# Patient Record
Sex: Female | Born: 1985 | Race: Black or African American | Hispanic: No | Marital: Single | Smoking: Current every day smoker
Health system: Southern US, Community
[De-identification: ages and names within clinical notes are randomized; demographics above are authoritative.]

## PROBLEM LIST (undated history)

## (undated) ENCOUNTER — Inpatient Hospital Stay: Payer: Self-pay

## (undated) DIAGNOSIS — G5600 Carpal tunnel syndrome, unspecified upper limb: Secondary | ICD-10-CM

## (undated) DIAGNOSIS — R0600 Dyspnea, unspecified: Secondary | ICD-10-CM

## (undated) DIAGNOSIS — K3580 Unspecified acute appendicitis: Secondary | ICD-10-CM

## (undated) DIAGNOSIS — IMO0002 Reserved for concepts with insufficient information to code with codable children: Secondary | ICD-10-CM

## (undated) HISTORY — DX: Unspecified acute appendicitis: K35.80

## (undated) HISTORY — DX: Dyspnea, unspecified: R06.00

## (undated) HISTORY — DX: Carpal tunnel syndrome, unspecified upper limb: G56.00

## (undated) HISTORY — PX: TONSILLECTOMY: SUR1361

---
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
## 2007-05-08 ENCOUNTER — Emergency Department: Payer: Self-pay | Admitting: Emergency Medicine

## 2007-08-06 ENCOUNTER — Emergency Department: Payer: Self-pay | Admitting: Emergency Medicine

## 2007-09-10 ENCOUNTER — Emergency Department: Payer: Self-pay | Admitting: Emergency Medicine

## 2008-06-26 ENCOUNTER — Emergency Department: Payer: Self-pay | Admitting: Emergency Medicine

## 2008-08-11 ENCOUNTER — Emergency Department: Payer: Self-pay | Admitting: Unknown Physician Specialty

## 2008-08-12 ENCOUNTER — Emergency Department: Payer: Self-pay | Admitting: Emergency Medicine

## 2008-12-26 ENCOUNTER — Emergency Department: Payer: Self-pay | Admitting: Internal Medicine

## 2009-03-18 ENCOUNTER — Emergency Department: Payer: Self-pay | Admitting: Emergency Medicine

## 2009-04-01 ENCOUNTER — Emergency Department: Payer: Self-pay | Admitting: Emergency Medicine

## 2009-05-30 ENCOUNTER — Emergency Department: Payer: Self-pay | Admitting: Emergency Medicine

## 2009-08-13 ENCOUNTER — Emergency Department: Payer: Self-pay | Admitting: Emergency Medicine

## 2009-08-14 ENCOUNTER — Observation Stay: Payer: Self-pay

## 2009-09-26 ENCOUNTER — Emergency Department: Payer: Self-pay | Admitting: Unknown Physician Specialty

## 2009-11-23 ENCOUNTER — Emergency Department: Payer: Self-pay | Admitting: Emergency Medicine

## 2009-12-12 ENCOUNTER — Emergency Department: Payer: Self-pay | Admitting: Emergency Medicine

## 2010-07-22 IMAGING — CT CT STONE STUDY
1 of 2 series · 15 of 32 positions shown, 19 images · non-contrast
Comparison: none

REASON FOR EXAM: right flank pain with hematuria
COMMENTS:   LMP: Two weeks ago

PROCEDURE:     CT  - CT ABDOMEN /PELVIS WO (STONE)  - September 26, 2009  [DATE]
RESULT:     CT of the abdomen and pelvis dated 09/26/2009.
TECHNIQUE: Helical noncontrasted 3 mm sections were obtained from lung bases
through the pubic symphysis.

[Series 2: stone · axial · 0.67mm/px · z∈[-357,+54]mm · 15 of 155 slices shown, 19 images]
[im 12/155  soft-tissue]
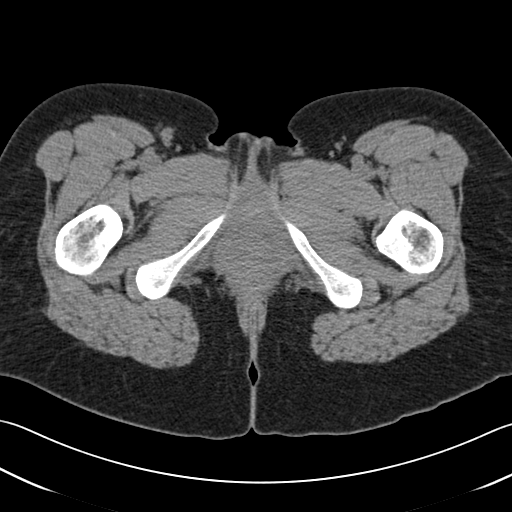
[im 12/155  bone]
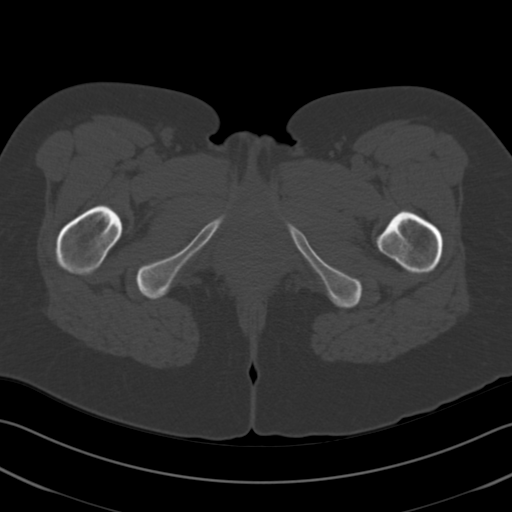
[im 23/155  soft-tissue]
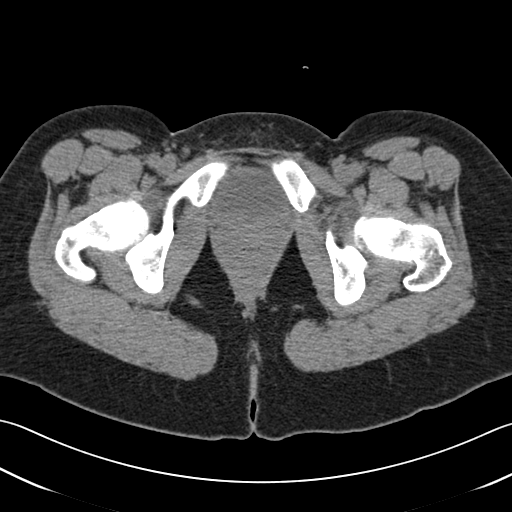
[im 35/155  soft-tissue]
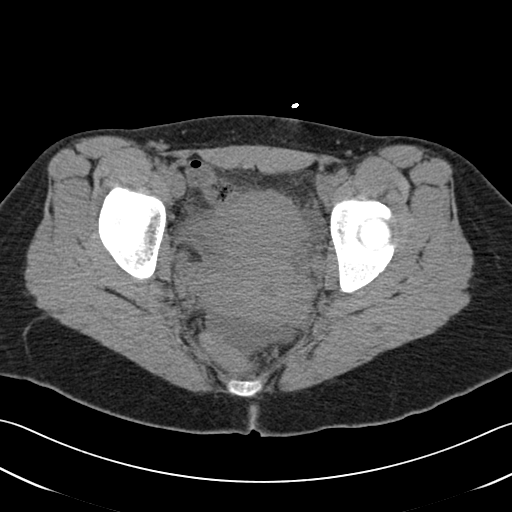
[im 46/155  soft-tissue]
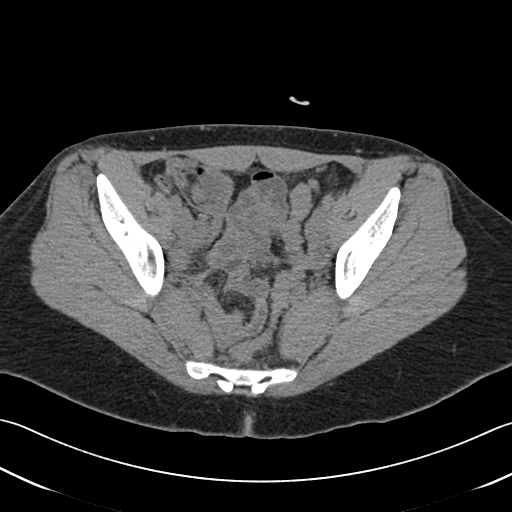
[im 58/155  soft-tissue]
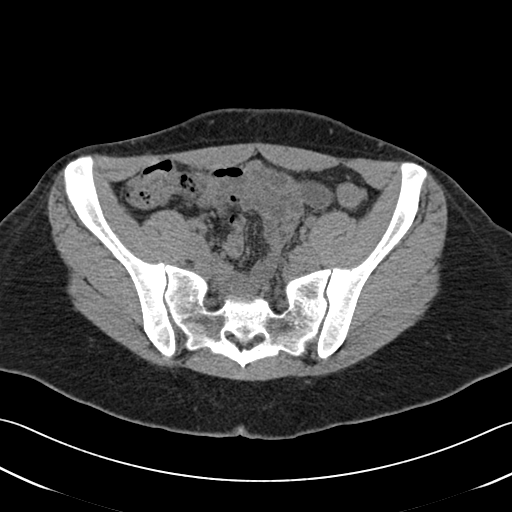
[im 69/155  soft-tissue]
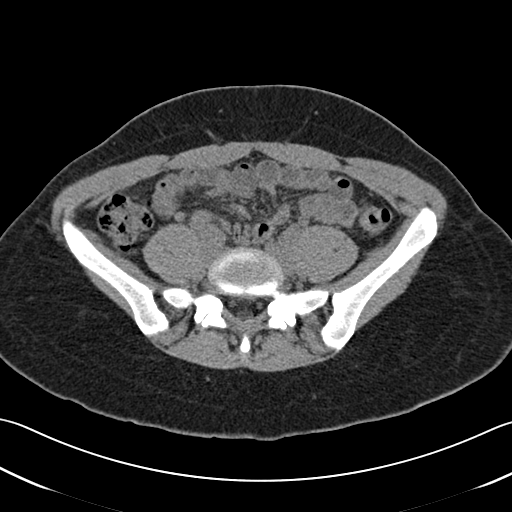
[im 80/155  soft-tissue]
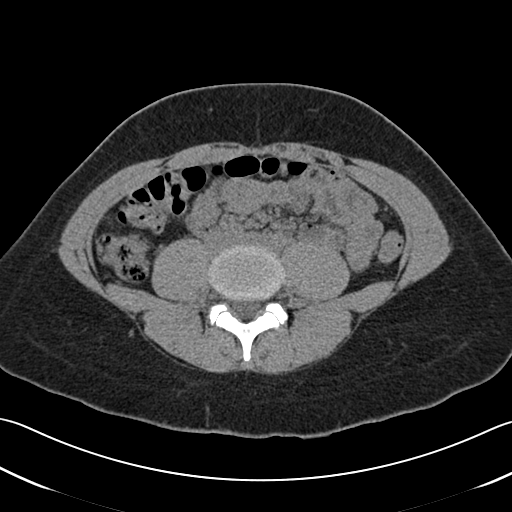
[im 92/155  soft-tissue]
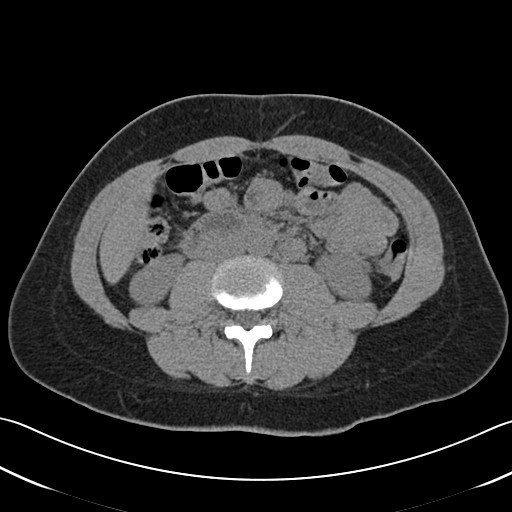
[im 103/155  soft-tissue]
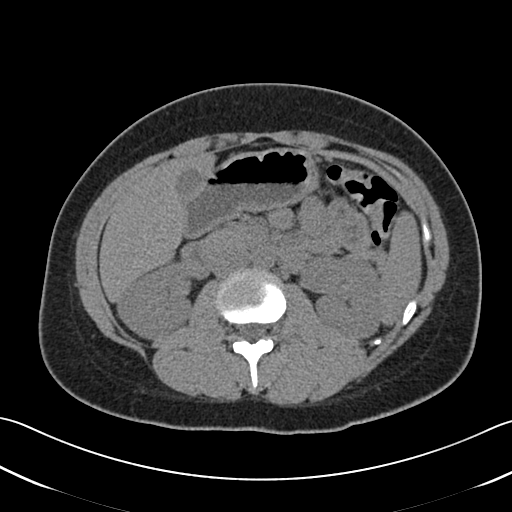
[im 103/155  bone]
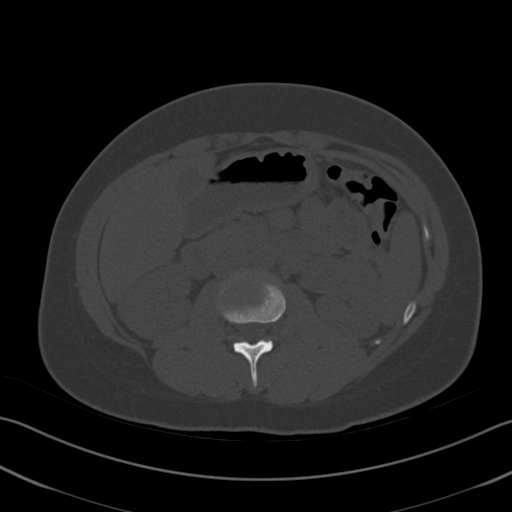
[im 115/155  soft-tissue]
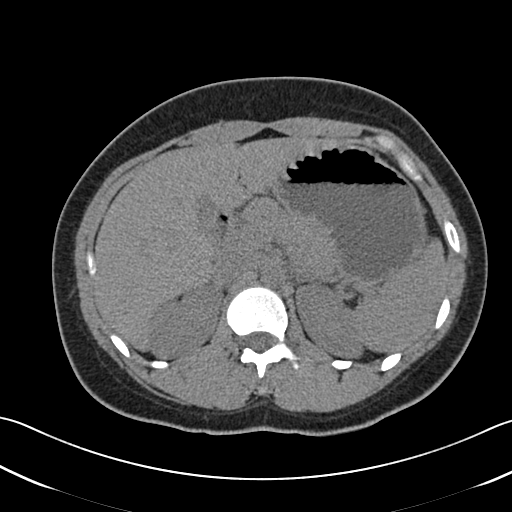
[im 126/155  soft-tissue]
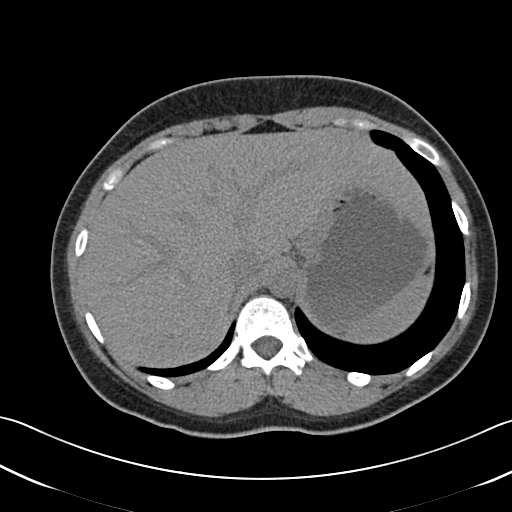
[im 132/155  lung]
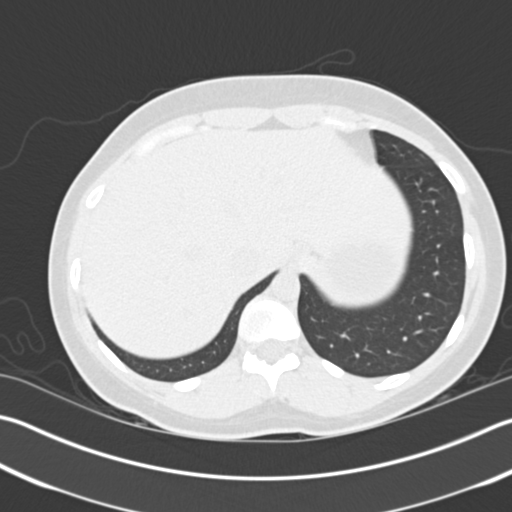
[im 137/155  soft-tissue]
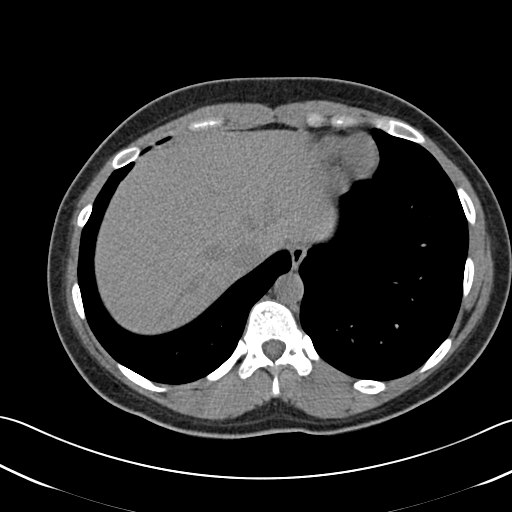
[im 137/155  lung]
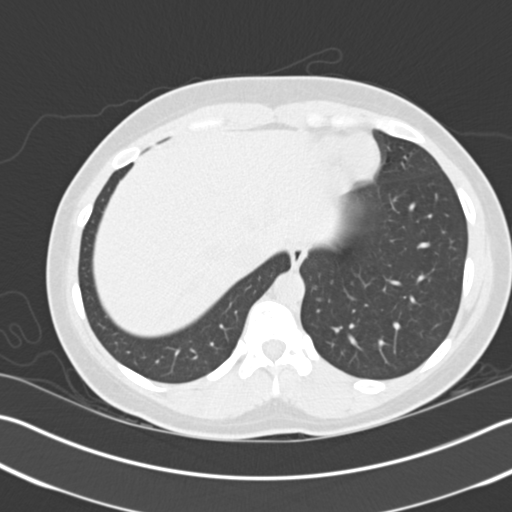
[im 143/155  lung]
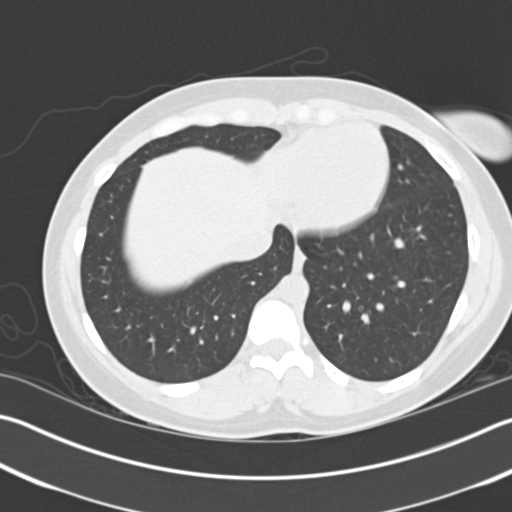
[im 149/155  soft-tissue]
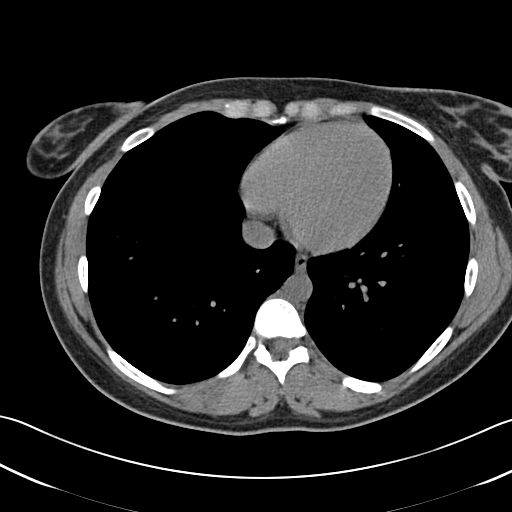
[im 149/155  lung]
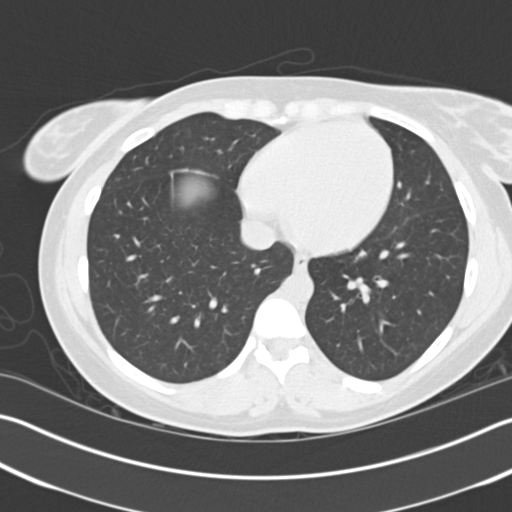

[15 of 32 positions shown; findings below may reference images not displayed]

FINDINGS: Evaluation of the lung bases demonstrates no gross abnormalities.

Within the limitations of a noncontrasted CT the liver, spleen, adrenals,
pancreas, kidneys are unremarkable. Specifically there is no evidence of
hydronephrosis, hydroureter, nephrolithiasis nor ureterolithiasis. A small
amount of free fluid is identified in the region of the cul-de-sac.

There is no CT evidence of bowel obstruction, enteritis, colitis, nor
diverticulitis within the limitations of a noncontrasted CT. There is no CT
evidence of abdominal aortic aneurysm.

A small fat-containing umbilical hernia is appreciated.
IMPRESSION: No CT evidence of obstructive or inflammatory abnormalities
within the limitations of a noncontrasted renal colic protocol CT.
2. Small amount of fluid within the cul-de-sac possibly physiologic.

## 2010-10-01 ENCOUNTER — Emergency Department: Payer: Self-pay | Admitting: Emergency Medicine

## 2011-03-11 ENCOUNTER — Inpatient Hospital Stay: Payer: Self-pay

## 2011-06-14 ENCOUNTER — Emergency Department: Payer: Self-pay | Admitting: Emergency Medicine

## 2011-08-05 ENCOUNTER — Emergency Department: Payer: Self-pay | Admitting: Emergency Medicine

## 2012-03-24 ENCOUNTER — Emergency Department: Payer: Self-pay | Admitting: Unknown Physician Specialty

## 2012-03-25 ENCOUNTER — Emergency Department: Payer: Self-pay | Admitting: Emergency Medicine

## 2012-05-10 ENCOUNTER — Emergency Department: Payer: Self-pay | Admitting: Emergency Medicine

## 2013-05-12 ENCOUNTER — Emergency Department: Payer: Self-pay | Admitting: Emergency Medicine

## 2013-10-13 ENCOUNTER — Emergency Department: Payer: Self-pay | Admitting: Emergency Medicine

## 2013-10-13 LAB — COMPREHENSIVE METABOLIC PANEL
ALK PHOS: 57 U/L
ANION GAP: 5 — AB (ref 7–16)
AST: 27 U/L (ref 15–37)
Albumin: 3.8 g/dL (ref 3.4–5.0)
BUN: 10 mg/dL (ref 7–18)
Bilirubin,Total: 0.5 mg/dL (ref 0.2–1.0)
CALCIUM: 9 mg/dL (ref 8.5–10.1)
CHLORIDE: 106 mmol/L (ref 98–107)
Co2: 27 mmol/L (ref 21–32)
Creatinine: 0.85 mg/dL (ref 0.60–1.30)
EGFR (African American): >60
EGFR (Non-African Amer.): >60
Glucose: 97 mg/dL (ref 65–99)
OSMOLALITY: 275 (ref 275–301)
Potassium: 3.7 mmol/L (ref 3.5–5.1)
SGPT (ALT): 32 U/L
SODIUM: 138 mmol/L (ref 136–145)
Total Protein: 8 g/dL (ref 6.4–8.2)

## 2013-10-13 LAB — CBC WITH DIFFERENTIAL/PLATELET
Basophil #: 0.1 10*3/uL (ref 0.0–0.1)
Basophil %: 0.4 %
Eosinophil #: 0.1 10*3/uL (ref 0.0–0.7)
Eosinophil %: 1.2 %
HCT: 44.7 % (ref 35.0–47.0)
HGB: 14.4 g/dL (ref 12.0–16.0)
LYMPHS PCT: 13.5 %
Lymphocyte #: 1.7 10*3/uL (ref 1.0–3.6)
MCH: 28.4 pg (ref 26.0–34.0)
MCHC: 32.1 g/dL (ref 32.0–36.0)
MCV: 89 fL (ref 80–100)
Monocyte #: 0.5 x10 3/mm (ref 0.2–0.9)
Monocyte %: 3.6 %
Neutrophil #: 10.2 10*3/uL — ABNORMAL HIGH (ref 1.4–6.5)
Neutrophil %: 81.3 %
PLATELETS: 320 10*3/uL (ref 150–440)
RBC: 5.05 10*6/uL (ref 3.80–5.20)
RDW: 13.1 % (ref 11.5–14.5)
WBC: 12.6 10*3/uL — ABNORMAL HIGH (ref 3.6–11.0)

## 2013-10-13 LAB — URINALYSIS, COMPLETE
BACTERIA: NONE SEEN
Bilirubin,UR: NEGATIVE
Blood: NEGATIVE
GLUCOSE, UR: NEGATIVE mg/dL (ref 0–75)
Ketone: NEGATIVE
Leukocyte Esterase: NEGATIVE
Nitrite: NEGATIVE
Ph: 6 (ref 4.5–8.0)
Protein: NEGATIVE
Specific Gravity: 1.025 (ref 1.003–1.030)
Squamous Epithelial: 8
WBC UR: 6 /HPF (ref 0–5)

## 2013-10-13 LAB — LIPASE, BLOOD: Lipase: 100 U/L (ref 73–393)

## 2014-03-24 NOTE — L&D Delivery Note (Signed)
VAGINAL DELIVERY NOTE:  Date of Delivery: 02/07/2015 Primary OB: [facility]  Gestational Age/EDD: 1734w3d 02/18/2015, by Other Basis Antepartum complications: Cervical Dysplasia with HPV, history PTD Attending Physician: Annamarie MajorPaul Delbert Darley, MD, FACOG Delivery Type: spontaneous vaginal delivery  Anesthesia: none Laceration: none Episiotomy: none Placenta: spontaneous Intrapartum complications: None Estimated Blood Loss: 250 GBS: neg Procedure Details: precipitous Baby: Liveborn female, Apgars 9/9

## 2014-11-04 ENCOUNTER — Encounter: Payer: Self-pay | Admitting: Emergency Medicine

## 2014-11-04 ENCOUNTER — Emergency Department
Admission: EM | Admit: 2014-11-04 | Discharge: 2014-11-04 | Disposition: A | Discharge status: Home / Self Care | Payer: Medicaid Other | Attending: Emergency Medicine | Admitting: Emergency Medicine

## 2014-11-04 DIAGNOSIS — B850 Pediculosis due to Pediculus humanus capitis: Secondary | ICD-10-CM | POA: Diagnosis not present

## 2014-11-04 DIAGNOSIS — Z720 Tobacco use: Secondary | ICD-10-CM | POA: Diagnosis not present

## 2014-11-04 MED ORDER — PERMETHRIN 1 % EX LOTN: TOPICAL_LOTION | CUTANEOUS | Status: DC

## 2014-11-04 NOTE — ED Provider Notes (Signed)
[facility] Emergency Department Provider Note  ____________________________________________  Time seen: Approximately 2:50 PM  I have reviewed the triage vital signs and the nursing notes.   HISTORY  Chief Complaint Head Lice    HPI Annette Cline is a 29 y.o. female who presents for evaluation of head lice. Patient states that it has noticed her hair last night as well as the risks of the family. Denies any other complaints at this time.   History reviewed. No pertinent past medical history.  There are no active problems to display for this patient.   History reviewed. No pertinent past surgical history.  Current Outpatient Rx  Name  Route  Sig  Dispense  Refill  . permethrin (PERMETHRIN LICE TREATMENT) 1 % lotion      Apply to affected area once   60 mL   1     Allergies Sulfa antibiotics  History reviewed. No pertinent family history.  Social History Social History  Substance Use Topics  . Smoking status: Current Every Day Smoker -- 1.00 packs/day    Types: Cigarettes  . Smokeless tobacco: None  . Alcohol Use: No    Review of Systems Constitutional: No fever/chills Eyes: No visual changes. ENT: No sore throat. Cardiovascular: Denies chest pain. Respiratory: Denies shortness of breath. Gastrointestinal: No abdominal pain.  No nausea, no vomiting.  No diarrhea.  No constipation. Genitourinary: Negative for dysuria. Musculoskeletal: Negative for back pain. Skin: Negative for rash. Positive for for head lice. Neurological: Negative for headaches, focal weakness or numbness.  10-point ROS otherwise negative.  ____________________________________________   PHYSICAL EXAM:  VITAL SIGNS: ED Triage Vitals  Enc Vitals Group     BP 11/04/14 1152 111/58 mmHg     Pulse Rate 11/04/14 1152 72     Resp --      Temp 11/04/14 1152 98.6 F (37 C)     Temp src --      SpO2 11/04/14 1152 98 %     Weight 11/04/14 1152 179 lb (81.194  kg)     Height 11/04/14 1152 5\' 5"  (1.651 m)     Head Cir --      Peak Flow --      Pain Score --      Pain Loc --      Pain Edu? --      Excl. in GC? --     Constitutional: Alert and oriented. Well appearing and in no acute distress. Eyes: Conjunctivae are normal. PERRL. EOMI. Head: Atraumatic. Positive for head lice. Neurologic:  Normal speech and language. No gross focal neurologic deficits are appreciated. No gait instability. Skin:  Skin is warm, dry and intact. No rash noted. Psychiatric: Mood and affect are normal. Speech and behavior are normal.  ____________________________________________   LABS (all labs ordered are listed, but only abnormal results are displayed)  Labs Reviewed - No data to display ____________________________________________   PROCEDURES  Procedure(s) performed: None  Critical Care performed: No  ____________________________________________   INITIAL IMPRESSION / ASSESSMENT AND PLAN / ED COURSE  Pertinent labs & imaging results that were available during my care of the patient were reviewed by me and considered in my medical decision making (see chart for details).  Head lice. Rx given for permethrin cream as well as instructions given to parents for cleaning techniques. Patient follow-up with PCP or return to the ER with any worsening symptomology. She voices no other emergency medical complaints at this time. ____________________________________________   FINAL  CLINICAL IMPRESSION(S) / ED DIAGNOSES  Final diagnoses:  Head lice infestation      Evangeline Dakin, PA-C 11/04/14 1452  Jene Every, MD 11/04/14 1455

## 2014-11-04 NOTE — Discharge Instructions (Signed)
Head and Pubic Lice °Lice are tiny, light brown insects with claws on the ends of their legs. They are small parasites that live on the human body. Lice often make their home in your hair. They hatch from little round eggs (nits), which are attached to the base of hairs. They spread by: °· Direct contact with an infested person. °· Infested personal items such as combs, brushes, towels, clothing, pillow cases and sheets. °The parasite that causes your condition may also live in clothes which have been worn within the week before treatment. Therefore, it is necessary to wash your clothes, bed linens, towels, combs and brushes. Any woolens can be put in an air-tight plastic bag for one week. You need to use fresh clothes, towels and sheets after your treatment is completed. Re-treatment is usually not necessary if instructions are followed. If necessary, treatment may be repeated in 7 days. The entire family may require treatment. Sexual partners should be treated if the nits are present in the pubic area. °TREATMENT °· Apply enough medicated shampoo or cream to wet hair and skin in and around the infected areas. °· Work thoroughly into hair and leave in according to instructions. °· Add a small amount of water until a good lather forms. °· Rinse thoroughly. °· Towel briskly. °· When hair is dry, any remaining nits, cream or shampoo may be removed with a fine-tooth comb or tweezers. The nits resemble dandruff; however they are glued to the hair follicle and are difficult to brush out. Frequent fine combing and shampoos are necessary. A towel soaked in white vinegar and left on the hair for 2 hours will also help soften the glue which holds the nits on the hair. °Medicated shampoo or cream should not be used on children or pregnant women without a caregiver's prescription or instructions. °SEEK MEDICAL CARE IF:  °· You or your child develops sores that look infected. °· The rash does not go away in one week. °· The  lice or nits return or persist in spite of treatment. °Document Released: 03/10/2005 Document Revised: 06/02/2011 Document Reviewed: 10/07/2006 °ExitCare® Patient Information ©2015 [employer]. This information is not intended to replace advice given to you by your health care provider. Make sure you discuss any questions you have with your health care provider. ° °

## 2014-11-04 NOTE — ED Notes (Signed)
Pt states she noticed lice in her hair and families last night.

## 2015-02-07 ENCOUNTER — Inpatient Hospital Stay
Admission: EM | Admit: 2015-02-07 | Discharge: 2015-02-08 | DRG: 774 | Disposition: A | Discharge status: Home / Self Care | Payer: Medicaid Other | Attending: Obstetrics & Gynecology | Admitting: Obstetrics & Gynecology

## 2015-02-07 ENCOUNTER — Other Ambulatory Visit: Payer: Self-pay | Admitting: Obstetrics & Gynecology

## 2015-02-07 DIAGNOSIS — Z882 Allergy status to sulfonamides status: Secondary | ICD-10-CM | POA: Diagnosis not present

## 2015-02-07 DIAGNOSIS — O9832 Other infections with a predominantly sexual mode of transmission complicating childbirth: Secondary | ICD-10-CM | POA: Diagnosis present

## 2015-02-07 DIAGNOSIS — Z3A38 38 weeks gestation of pregnancy: Secondary | ICD-10-CM

## 2015-02-07 DIAGNOSIS — A630 Anogenital (venereal) warts: Secondary | ICD-10-CM | POA: Diagnosis present

## 2015-02-07 DIAGNOSIS — N879 Dysplasia of cervix uteri, unspecified: Secondary | ICD-10-CM | POA: Diagnosis present

## 2015-02-07 DIAGNOSIS — Z9889 Other specified postprocedural states: Secondary | ICD-10-CM | POA: Diagnosis not present

## 2015-02-07 HISTORY — DX: Reserved for concepts with insufficient information to code with codable children: IMO0002

## 2015-02-07 LAB — CBC
HCT: 35.6 % (ref 35.0–47.0)
Hemoglobin: 11.6 g/dL — ABNORMAL LOW (ref 12.0–16.0)
MCH: 27.8 pg (ref 26.0–34.0)
MCHC: 32.6 g/dL (ref 32.0–36.0)
MCV: 85.2 fL (ref 80.0–100.0)
PLATELETS: 263 10*3/uL (ref 150–440)
RBC: 4.18 MIL/uL (ref 3.80–5.20)
RDW: 13.7 % (ref 11.5–14.5)
WBC: 13.4 10*3/uL — ABNORMAL HIGH (ref 3.6–11.0)

## 2015-02-07 LAB — TYPE AND SCREEN
ABO/RH(D): A POS
Antibody Screen: NEGATIVE

## 2015-02-07 MED ORDER — CITRIC ACID-SODIUM CITRATE 334-500 MG/5ML PO SOLN
30.0000 mL | ORAL | Status: DC | PRN
  Filled 2015-02-07: qty 30

## 2015-02-07 MED ORDER — WITCH HAZEL-GLYCERIN EX PADS: 1.0000 "application " | MEDICATED_PAD | CUTANEOUS | Status: DC | PRN

## 2015-02-07 MED ORDER — OXYCODONE-ACETAMINOPHEN 5-325 MG PO TABS: 2.0000 | ORAL_TABLET | ORAL | Status: DC | PRN

## 2015-02-07 MED ORDER — FENTANYL CITRATE (PF) 100 MCG/2ML IJ SOLN
INTRAMUSCULAR | Status: AC
  Administered 2015-02-07: 50 ug via INTRAVENOUS
  Filled 2015-02-07: qty 2

## 2015-02-07 MED ORDER — SODIUM CHLORIDE 0.9 % IJ SOLN: 3.0000 mL | INTRAMUSCULAR | Status: DC | PRN

## 2015-02-07 MED ORDER — IBUPROFEN 600 MG PO TABS
600.0000 mg | ORAL_TABLET | Freq: Four times a day (QID) | ORAL | Status: DC
  Administered 2015-02-07 – 2015-02-08 (×5): 600 mg via ORAL
  Filled 2015-02-07 (×4): qty 1

## 2015-02-07 MED ORDER — OXYTOCIN BOLUS FROM INFUSION
500.0000 mL | INTRAVENOUS | Status: DC
  Administered 2015-02-07: 500 mL via INTRAVENOUS

## 2015-02-07 MED ORDER — SODIUM CHLORIDE 0.9 % IV SOLN: 250.0000 mL | INTRAVENOUS | Status: DC | PRN

## 2015-02-07 MED ORDER — ONDANSETRON HCL 4 MG PO TABS: 4.0000 mg | ORAL_TABLET | ORAL | Status: DC | PRN

## 2015-02-07 MED ORDER — DIBUCAINE 1 % RE OINT: 1.0000 "application " | TOPICAL_OINTMENT | RECTAL | Status: DC | PRN

## 2015-02-07 MED ORDER — LACTATED RINGERS IV SOLN
INTRAVENOUS | Status: DC
  Administered 2015-02-07: 05:00:00 via INTRAVENOUS

## 2015-02-07 MED ORDER — LIDOCAINE HCL (PF) 1 % IJ SOLN
30.0000 mL | INTRAMUSCULAR | Status: DC | PRN
  Filled 2015-02-07: qty 30

## 2015-02-07 MED ORDER — OXYTOCIN 40 UNITS IN LACTATED RINGERS INFUSION - SIMPLE MED
62.5000 mL/h | INTRAVENOUS | Status: DC
  Administered 2015-02-07: 62.5 mL/h via INTRAVENOUS
  Filled 2015-02-07 (×2): qty 1000

## 2015-02-07 MED ORDER — IBUPROFEN 600 MG PO TABS
ORAL_TABLET | ORAL | Status: AC
  Administered 2015-02-07: 600 mg via ORAL
  Filled 2015-02-07: qty 1

## 2015-02-07 MED ORDER — DIPHENHYDRAMINE HCL 25 MG PO CAPS: 25.0000 mg | ORAL_CAPSULE | Freq: Four times a day (QID) | ORAL | Status: DC | PRN

## 2015-02-07 MED ORDER — SODIUM CHLORIDE 0.9 % IJ SOLN: 3.0000 mL | Freq: Two times a day (BID) | INTRAMUSCULAR | Status: DC

## 2015-02-07 MED ORDER — ACETAMINOPHEN 325 MG PO TABS: 650.0000 mg | ORAL_TABLET | ORAL | Status: DC | PRN

## 2015-02-07 MED ORDER — SIMETHICONE 80 MG PO CHEW: 80.0000 mg | CHEWABLE_TABLET | ORAL | Status: DC | PRN

## 2015-02-07 MED ORDER — ONDANSETRON HCL 4 MG/2ML IJ SOLN: 4.0000 mg | INTRAMUSCULAR | Status: DC | PRN

## 2015-02-07 MED ORDER — FENTANYL CITRATE (PF) 100 MCG/2ML IJ SOLN
50.0000 ug | Freq: Once | INTRAMUSCULAR | Status: AC
  Administered 2015-02-07: 50 ug via INTRAVENOUS

## 2015-02-07 MED ORDER — OXYTOCIN 40 UNITS IN LACTATED RINGERS INFUSION - SIMPLE MED: 62.5000 mL/h | INTRAVENOUS | Status: DC | PRN

## 2015-02-07 MED ORDER — LACTATED RINGERS IV SOLN: 500.0000 mL | INTRAVENOUS | Status: DC | PRN

## 2015-02-07 MED ORDER — SENNOSIDES-DOCUSATE SODIUM 8.6-50 MG PO TABS: 2.0000 | ORAL_TABLET | ORAL | Status: DC

## 2015-02-07 MED ORDER — OXYCODONE-ACETAMINOPHEN 5-325 MG PO TABS: 1.0000 | ORAL_TABLET | ORAL | Status: DC | PRN

## 2015-02-07 MED ORDER — BENZOCAINE-MENTHOL 20-0.5 % EX AERO
1.0000 "application " | INHALATION_SPRAY | CUTANEOUS | Status: DC | PRN
  Administered 2015-02-07: 1 via TOPICAL
  Filled 2015-02-07: qty 56

## 2015-02-07 MED ORDER — ONDANSETRON HCL 4 MG/2ML IJ SOLN: 4.0000 mg | Freq: Four times a day (QID) | INTRAMUSCULAR | Status: DC | PRN

## 2015-02-07 MED ORDER — ZOLPIDEM TARTRATE 5 MG PO TABS: 5.0000 mg | ORAL_TABLET | Freq: Every evening | ORAL | Status: DC | PRN

## 2015-02-07 MED ORDER — OXYCODONE-ACETAMINOPHEN 5-325 MG PO TABS
1.0000 | ORAL_TABLET | ORAL | Status: DC | PRN
  Administered 2015-02-07 – 2015-02-08 (×6): 1 via ORAL
  Filled 2015-02-07 (×6): qty 1

## 2015-02-07 NOTE — H&P (Signed)
Obstetrics Admission History & Physical   Rupture of Membranes   HPI:  29 y.o. [id number] @ 3074w3d (02/18/2015). Admitted on 02/07/2015:   Patient Active Problem List   Diagnosis Date Noted  . Labor and delivery indication for care or intervention 02/07/2015    Presents for labor, with SROM and rapidly progressing.  [facility]  PMHx:  Past Medical History  Diagnosis Date  . Abnormal cervical Pap smear with positive HPV DNA test     HPV  . Preterm labor    PSHx:  Past Surgical History  Procedure Laterality Date  . Tonsillectomy     Medications:  Prescriptions prior to admission  Medication Sig Dispense Refill Last Dose  . permethrin (PERMETHRIN LICE TREATMENT) 1 % lotion Apply to affected area once 60 mL 1    Allergies: is allergic to sulfa antibiotics. OBHx:  OB History  Gravida Para Term Preterm AB SAB TAB Ectopic Multiple Living  8 2 1 1 5 3 2  0 0 2    # Outcome Date GA Lbr Len/2nd Weight Sex Delivery Anes PTL Lv  8 Current           7 Preterm           6 Term           5 TAB           4 TAB           3 SAB           2 SAB           1 SAB              AVW:[id number]:Negative/unremarkable except as detailed in HPI. Soc Hx: Pregnancy welcomed  Objective:   Filed Vitals:   02/07/15 0428  BP: 121/66  Pulse: 85  Temp: 97.9 F (36.6 C)  Resp: 20   General: Well nourished, well developed female in no acute distress.  Skin: Warm and dry.  Cardiovascular:Regular rate and rhythm. Respiratory: Clear to auscultation bilateral. Normal respiratory effort Abdomen: gravid tender w ctxs Neuro/Psych: Normal mood and affect.   Pelvic exam: is not limited by body habitus EGBUS: within normal limits Vagina: within normal limits and with normal mucosa blood in the vault Cervix: 5 cm on admission, then 9 cm Uterus: Spontaneous uterine activity  Adnexa: not evaluated  EFM:FHR: 150 bpm, variability: moderate,  accelerations:  Present,  decelerations:   Absent Toco: Frequency: Every 3 minutes   Perinatal info:  Blood type: A positive Rubella- Immune Varicella -Immune TDaP Given during third trimester of this pregnancy RPR NR / HIV Neg/ HBsAg Neg   Assessment & Plan:   29 y.o. [id number] @ 6274w3d, Admitted on 02/07/2015: labor management     Admit for labor and Fetal Wellbeing Reassuring

## 2015-02-07 NOTE — Discharge Summary (Signed)
Obstetrical Discharge Summary  Date of Admission: 02/07/2015 Date of Discharge: 02/08/2015 Discharge Diagnosis: Term Pregnancy-delivered and Preciritous Labor Primary OB:  [facility]   Gestational Age at Delivery: 2559w3d  Antepartum complications: Cervical Dysplasia/HPV Date of Delivery: 02/07/15  Delivered By: Tiburcio PeaHarris Delivery Type: spontaneous vaginal delivery Intrapartum complications/course: precipitous Anesthesia: none Placenta: spontaneous Laceration: none Episiotomy: none Live born female  Birth Weight:   APGAR: 9, 9   Post partum course: Since the delivery, patient has tolerate activity, diet, and daily functions without difficulty or complication.  Min lochia.  No breast concerns at this time.  No signs of depression currently.   Postpartum Exam:General appearance: alert, appears stated age and no distress GI: soft, non-tender; bowel sounds normal; no masses,  no organomegaly and Fundus firm Extremities: extremities normal, atraumatic, no cyanosis or edema and no edema, redness or tenderness in the calves or thighs  Disposition: home with infant Rh Immune globulin given: no Rubella vaccine given: no Varicella vaccine given: no Tdap vaccine given in AP or PP setting: given during prenatal care Flu vaccine given in AP or PP setting: no Contraception: no method, Nexplanon, to be determined at post partum visit  Prenatal Labs: A+//Rubella Immune//RPR negative//HIV negative/HepB Surface Ag negative//plans to bottle feed  Plan:  Annette Cline was discharged to home in good condition. [facility] weeks at which time we will also make arrangement for you LEEP  Discharge Medications:   Medication List    STOP taking these medications        permethrin 1 % lotion  Commonly known as:  PERMETHRIN LICE TREATMENT      TAKE these medications        ibuprofen 600 MG tablet  Commonly known as:  ADVIL,MOTRIN  Take 1 tablet (600 mg total)  by mouth every 6 (six) hours.         No future appointments.

## 2015-02-07 NOTE — Discharge Instructions (Signed)

## 2015-02-08 LAB — CBC
HEMATOCRIT: 32.4 % — AB (ref 35.0–47.0)
Hemoglobin: 10.4 g/dL — ABNORMAL LOW (ref 12.0–16.0)
MCH: 27.8 pg (ref 26.0–34.0)
MCHC: 32.2 g/dL (ref 32.0–36.0)
MCV: 86.3 fL (ref 80.0–100.0)
Platelets: 216 10*3/uL (ref 150–440)
RBC: 3.75 MIL/uL — ABNORMAL LOW (ref 3.80–5.20)
RDW: 13.6 % (ref 11.5–14.5)
WBC: 13.2 10*3/uL — AB (ref 3.6–11.0)

## 2015-02-08 LAB — RPR: RPR Ser Ql: NONREACTIVE

## 2015-02-08 MED ORDER — IBUPROFEN 600 MG PO TABS: 600.0000 mg | ORAL_TABLET | Freq: Four times a day (QID) | ORAL | Status: DC

## 2015-02-08 NOTE — Progress Notes (Signed)
Patient understands all discharge instructions and the need to make follow up appointments. Patient discharge via wheelchair with auxillary. 

## 2015-03-25 NOTE — L&D Delivery Note (Signed)
Delivery Note At 5:49 PM a viable female infant was delivered via Vaginal, Spontaneous Delivery (Presentation: ROA).  APGAR: 2, 7, 9; weight pending.   Placenta status: delivered spontaneously and intact.  Cord:  3VC with the following complications: None.  Cord pH: n/a  Anesthesia:  None Episiotomy: None Lacerations: None Suture Repair: n/a Est. Blood Loss (mL): 300  Mom to postpartum.  Baby to Couplet care / Skin to Skin .  Called to see patient.  Mom pushed to deliver a viable female infant.  The head followed by shoulders, which delivered without difficulty, and the rest of the body.  No nuchal cord noted.  Baby to mom's chest.  Cord clamped and cut after > 1 min delay.  No cord blood obtained.  Placenta delivered spontaneously, intact, with a 3-vessel cord.  No vaginal, cervical, or perineal lacerations. All counts correct.  Hemostasis obtained with IV pitocin and fundal massage. EBL 300 mL.  Patient precipitously went from 3cm to fully dilated and pushing in a very short time-frame. She had received IV pain medication (fentanyl and diluadid).  Perinatology called in to eval infant who recovered from a quick delivery without difficulty.  [facility] evaluation shows estimated gestational age at >40 weeks.  Thomasene MohairStephen Arianna Haydon, MD 02/06/2016 6:02 PM

## 2015-04-17 ENCOUNTER — Encounter: Payer: Self-pay | Admitting: *Deleted

## 2015-04-17 ENCOUNTER — Ambulatory Visit
Admission: RE | Admit: 2015-04-17 | Discharge: 2015-04-17 | Disposition: A | Discharge status: Home / Self Care | Payer: Medicaid Other | Source: Ambulatory Visit | Attending: Obstetrics & Gynecology | Admitting: Obstetrics & Gynecology

## 2015-04-17 ENCOUNTER — Ambulatory Visit: Payer: Medicaid Other | Admitting: Anesthesiology

## 2015-04-17 ENCOUNTER — Encounter
Admission: RE | Disposition: A | Discharge status: Home / Self Care | Payer: Self-pay | Source: Ambulatory Visit | Attending: Obstetrics & Gynecology

## 2015-04-17 DIAGNOSIS — D069 Carcinoma in situ of cervix, unspecified: Secondary | ICD-10-CM | POA: Insufficient documentation

## 2015-04-17 DIAGNOSIS — F17200 Nicotine dependence, unspecified, uncomplicated: Secondary | ICD-10-CM | POA: Insufficient documentation

## 2015-04-17 DIAGNOSIS — K219 Gastro-esophageal reflux disease without esophagitis: Secondary | ICD-10-CM | POA: Insufficient documentation

## 2015-04-17 HISTORY — PX: CERVICAL CONIZATION W/BX: SHX1330

## 2015-04-17 LAB — CBC
HEMATOCRIT: 36.4 % (ref 35.0–47.0)
Hemoglobin: 11.7 g/dL — ABNORMAL LOW (ref 12.0–16.0)
MCH: 27 pg (ref 26.0–34.0)
MCHC: 32.2 g/dL (ref 32.0–36.0)
MCV: 83.7 fL (ref 80.0–100.0)
Platelets: 252 10*3/uL (ref 150–440)
RBC: 4.35 MIL/uL (ref 3.80–5.20)
RDW: 13.8 % (ref 11.5–14.5)
WBC: 8 10*3/uL (ref 3.6–11.0)

## 2015-04-17 LAB — TYPE AND SCREEN
ABO/RH(D): A POS
ANTIBODY SCREEN: NEGATIVE

## 2015-04-17 LAB — BASIC METABOLIC PANEL
Anion gap: 5 (ref 5–15)
BUN: 12 mg/dL (ref 6–20)
CO2: 25 mmol/L (ref 22–32)
Calcium: 8.7 mg/dL — ABNORMAL LOW (ref 8.9–10.3)
Chloride: 110 mmol/L (ref 101–111)
Creatinine, Ser: 0.78 mg/dL (ref 0.44–1.00)
GFR calc Af Amer: >60 mL/min (ref >60)
GLUCOSE: 87 mg/dL (ref 65–99)
POTASSIUM: 3.6 mmol/L (ref 3.5–5.1)
Sodium: 140 mmol/L (ref 135–145)

## 2015-04-17 LAB — POCT PREGNANCY, URINE: Preg Test, Ur: NEGATIVE

## 2015-04-17 SURGERY — CONE BIOPSY, CERVIX
Anesthesia: General | Site: Vagina | Wound class: Clean Contaminated

## 2015-04-17 MED ORDER — FENTANYL CITRATE (PF) 100 MCG/2ML IJ SOLN
25.0000 ug | INTRAMUSCULAR | Status: DC | PRN
  Administered 2015-04-17 (×4): 25 ug via INTRAVENOUS

## 2015-04-17 MED ORDER — LIDOCAINE-EPINEPHRINE 1 %-1:100000 IJ SOLN
INTRAMUSCULAR | Status: AC
  Filled 2015-04-17: qty 1

## 2015-04-17 MED ORDER — CEFAZOLIN SODIUM 1-5 GM-% IV SOLN
INTRAVENOUS | Status: DC | PRN
  Administered 2015-04-17: 2 g via INTRAVENOUS

## 2015-04-17 MED ORDER — CEFAZOLIN SODIUM-DEXTROSE 2-3 GM-% IV SOLR: 2.0000 g | Freq: Once | INTRAVENOUS | Status: DC

## 2015-04-17 MED ORDER — LIDOCAINE HCL (CARDIAC) 20 MG/ML IV SOLN
INTRAVENOUS | Status: DC | PRN
  Administered 2015-04-17: 50 mg via INTRAVENOUS

## 2015-04-17 MED ORDER — EPHEDRINE SULFATE 50 MG/ML IJ SOLN
INTRAMUSCULAR | Status: DC | PRN
  Administered 2015-04-17: 5 mg via INTRAVENOUS

## 2015-04-17 MED ORDER — ACETAMINOPHEN 650 MG RE SUPP: 650.0000 mg | RECTAL | Status: DC | PRN

## 2015-04-17 MED ORDER — ACETAMINOPHEN 325 MG PO TABS: 650.0000 mg | ORAL_TABLET | ORAL | Status: DC | PRN

## 2015-04-17 MED ORDER — FAMOTIDINE 20 MG PO TABS
20.0000 mg | ORAL_TABLET | Freq: Once | ORAL | Status: AC
  Administered 2015-04-17: 20 mg via ORAL

## 2015-04-17 MED ORDER — FENTANYL CITRATE (PF) 100 MCG/2ML IJ SOLN
INTRAMUSCULAR | Status: DC | PRN
  Administered 2015-04-17 (×3): 50 ug via INTRAVENOUS

## 2015-04-17 MED ORDER — ONDANSETRON HCL 4 MG/2ML IJ SOLN
INTRAMUSCULAR | Status: DC | PRN
  Administered 2015-04-17: 4 mg via INTRAVENOUS

## 2015-04-17 MED ORDER — PROMETHAZINE HCL 25 MG/ML IJ SOLN: 6.2500 mg | INTRAMUSCULAR | Status: DC | PRN

## 2015-04-17 MED ORDER — LIDOCAINE-EPINEPHRINE 1 %-1:100000 IJ SOLN
INTRAMUSCULAR | Status: DC | PRN
  Administered 2015-04-17: 10 mL

## 2015-04-17 MED ORDER — KETOROLAC TROMETHAMINE 30 MG/ML IJ SOLN
30.0000 mg | Freq: Four times a day (QID) | INTRAMUSCULAR | Status: DC
  Administered 2015-04-17: 30 mg via INTRAVENOUS

## 2015-04-17 MED ORDER — FENTANYL CITRATE (PF) 100 MCG/2ML IJ SOLN
INTRAMUSCULAR | Status: AC
  Administered 2015-04-17: 25 ug via INTRAVENOUS
  Filled 2015-04-17: qty 2

## 2015-04-17 MED ORDER — DEXAMETHASONE SODIUM PHOSPHATE 10 MG/ML IJ SOLN
INTRAMUSCULAR | Status: DC | PRN
  Administered 2015-04-17: 5 mg via INTRAVENOUS

## 2015-04-17 MED ORDER — KETOROLAC TROMETHAMINE 30 MG/ML IJ SOLN
INTRAMUSCULAR | Status: AC
  Filled 2015-04-17: qty 1

## 2015-04-17 MED ORDER — CEFAZOLIN SODIUM-DEXTROSE 2-3 GM-% IV SOLR
INTRAVENOUS | Status: AC
  Filled 2015-04-17: qty 50

## 2015-04-17 MED ORDER — MIDAZOLAM HCL 2 MG/2ML IJ SOLN
INTRAMUSCULAR | Status: DC | PRN
  Administered 2015-04-17: 2 mg via INTRAVENOUS

## 2015-04-17 MED ORDER — FERRIC SUBSULFATE 259 MG/GM EX SOLN
CUTANEOUS | Status: AC
  Filled 2015-04-17: qty 8

## 2015-04-17 MED ORDER — LACTATED RINGERS IV SOLN
INTRAVENOUS | Status: DC
  Administered 2015-04-17 (×2): via INTRAVENOUS

## 2015-04-17 MED ORDER — LIDOCAINE HCL (PF) 1 % IJ SOLN
INTRAMUSCULAR | Status: AC
  Filled 2015-04-17: qty 30

## 2015-04-17 MED ORDER — PROPOFOL 10 MG/ML IV BOLUS
INTRAVENOUS | Status: DC | PRN
  Administered 2015-04-17: 150 mg via INTRAVENOUS
  Administered 2015-04-17: 50 mg via INTRAVENOUS

## 2015-04-17 MED ORDER — FAMOTIDINE 20 MG PO TABS
ORAL_TABLET | ORAL | Status: AC
  Administered 2015-04-17: 20 mg via ORAL
  Filled 2015-04-17: qty 1

## 2015-04-17 MED ORDER — LUGOLS EX SOLN
CUTANEOUS | Status: DC | PRN
  Administered 2015-04-17: 4 mL via VAGINAL

## 2015-04-17 MED ORDER — IODINE STRONG (LUGOLS) 5 % PO SOLN
ORAL | Status: AC
  Filled 2015-04-17: qty 1

## 2015-04-17 SURGICAL SUPPLY — 38 items
APPLICATOR COTTON TIP 6IN STRL (MISCELLANEOUS) ×9 IMPLANT
APPLICATOR SWAB PROCTO LG 16IN (MISCELLANEOUS) ×3 IMPLANT
BLADE SURG SZ11 CARB STEEL (BLADE) ×3 IMPLANT
CANISTER SUCT 1200ML W/VALVE (MISCELLANEOUS) ×3 IMPLANT
CATH ROBINSON RED A/P 16FR (CATHETERS) ×3 IMPLANT
CNTNR SPEC 2.5X3XGRAD LEK (MISCELLANEOUS) ×2
CONT SPEC 4OZ STER OR WHT (MISCELLANEOUS) ×1
CONTAINER SPEC 2.5X3XGRAD LEK (MISCELLANEOUS) ×2 IMPLANT
DRAPE PERI LITHO V/GYN (MISCELLANEOUS) ×3 IMPLANT
DRAPE UNDER BUTTOCK W/FLU (DRAPES) ×6 IMPLANT
DRSG TELFA 3X8 NADH (GAUZE/BANDAGES/DRESSINGS) ×6 IMPLANT
ELECT LEEP BALL 5MM 12CM (MISCELLANEOUS) ×3
ELECT LEEP LOOP 20X10 R2010 (MISCELLANEOUS) ×3
ELECT LOOP 1.0X1.0CM R1010 (MISCELLANEOUS) ×3
ELECT REM PT RETURN 9FT ADLT (ELECTROSURGICAL) ×3
ELECTRODE LEEP BALL 5MM 12CM (MISCELLANEOUS) ×2 IMPLANT
ELECTRODE LEP LOOP 20X10 R2010 (MISCELLANEOUS) ×2 IMPLANT
ELECTRODE LOOP 1.0X1.0CM R1010 (MISCELLANEOUS) ×2 IMPLANT
ELECTRODE REM PT RTRN 9FT ADLT (ELECTROSURGICAL) ×2 IMPLANT
GLOVE BIOGEL PI IND STRL 6.5 (GLOVE) ×2 IMPLANT
GLOVE BIOGEL PI INDICATOR 6.5 (GLOVE) ×1
GLOVE SURG SYN 6.5 ES PF (GLOVE) ×3 IMPLANT
GOWN STRL REUS W/ TWL LRG LVL3 (GOWN DISPOSABLE) ×4 IMPLANT
GOWN STRL REUS W/TWL LRG LVL3 (GOWN DISPOSABLE) ×2
KIT RM TURNOVER CYSTO AR (KITS) ×3 IMPLANT
NEEDLE SPNL 22GX3.5 QUINCKE BK (NEEDLE) ×3 IMPLANT
NS IRRIG 500ML POUR BTL (IV SOLUTION) ×3 IMPLANT
PACK BASIN MINOR ARMC (MISCELLANEOUS) ×3 IMPLANT
PAD OB MATERNITY 4.3X12.25 (PERSONAL CARE ITEMS) ×3 IMPLANT
PAD PREP 24X41 OB/GYN DISP (PERSONAL CARE ITEMS) ×3 IMPLANT
STRAW SMOKE EVAC LEEP 6150 NON (MISCELLANEOUS) ×3 IMPLANT
SUT SILK 4 0 SH (SUTURE) ×12 IMPLANT
SUT VIC AB 2-0 CT1 27 (SUTURE) ×2
SUT VIC AB 2-0 CT1 TAPERPNT 27 (SUTURE) ×4 IMPLANT
SYR CONTROL 10ML (SYRINGE) ×3 IMPLANT
SYRINGE 10CC LL (SYRINGE) ×3 IMPLANT
TOWEL OR 17X26 4PK STRL BLUE (TOWEL DISPOSABLE) ×3 IMPLANT
TUBING CONNECTING 10 (TUBING) ×3 IMPLANT

## 2015-04-17 NOTE — H&P (Signed)
H&P Update  PLEASE SEE PAPER H&P  Pt was last seen in my office, and complete history and physical performed.  The surgical history has been reviewed and remains accurate without interval change. The patient was re-examined and patient's physiologic condition has not changed significantly in the last 30 days.  No new pharmacological allergies or types of therapy havebeen initiated.  Allergies  Allergen Reactions  . Sulfa Antibiotics Rash    Past Medical History  Diagnosis Date  . Abnormal cervical Pap smear with positive HPV DNA test     HPV  . Preterm labor    Past Surgical History  Procedure Laterality Date  . Tonsillectomy      BP 120/79 mmHg  Pulse 56  Temp(Src) 98.2 F (36.8 C) (Oral)  Resp 18  Ht  (1.626 m)  Wt 77.111 kg (170 lb)  BMI 29.17 kg/m2  SpO2 100%  LMP 04/16/2015 (Exact Date)  Breastfeeding? No  NAD RRR no murmurs CTAB, no wheezing, resps unlabored +BS, soft, NTTP No c/c/e Pelvic exam deferred  The above history was confirmed with the patient. 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 Surgical plan includes cold knife conization of cervix as confirmed on the consent. The treatment plan remains the same, without new options for care.  The patient understands the potential benefits and risks and the consents have been signed and placed on the chart.     Annette Plumber, MD Attending Obstetrician Gynecologist [facility]

## 2015-04-17 NOTE — Transfer of Care (Signed)
Immediate Anesthesia Transfer of Care Note  Patient: Annette Cline  Procedure(s) Performed: Procedure(s): CONIZATION CERVIX WITH BIOPSY (N/A)  Patient Location: PACU  Anesthesia Type:General  Level of Consciousness: awake, alert , oriented and sedated  Airway & Oxygen Therapy: Patient Spontanous Breathing and Patient connected to face mask oxygen  Post-op Assessment: Report given to RN and Post -op Vital signs reviewed and stable  Post vital signs: Reviewed and stable  Last Vitals:  Filed Vitals:   04/17/15 1026 04/17/15 1246  BP: 120/79 102/58  Pulse: 56 65  Temp: 36.8 C 36.4 C  Resp: 18 16    Complications: No apparent anesthesia complications

## 2015-04-17 NOTE — Anesthesia Procedure Notes (Signed)
Procedure Name: LMA Insertion Performed by: Tonia Ghent Pre-anesthesia Checklist: Patient identified, Emergency Drugs available, Suction available and Patient being monitored Patient Re-evaluated:Patient Re-evaluated prior to inductionOxygen Delivery Method: Circle system utilized Preoxygenation: Pre-oxygenation with 100% oxygen Intubation Type: IV induction Ventilation: Mask ventilation without difficulty LMA: LMA inserted LMA Size: 3.0 Number of attempts: 1

## 2015-04-17 NOTE — Discharge Instructions (Signed)
You should expect to have some cramping and vaginal bleeding for about a week. This should taper off and subside, much like a period. If heavy bleeding continues or gets worse, you should contact the office for an earlier appointment.   Please call the office or physician on call for fever >101, severe pain, and heavy bleeding.   [phone]  NOTHING IN THE VAGINA FOR 4 WEEKS!!  General Anesthesia, Adult, Care After Refer to this sheet in the next few weeks. These instructions provide you with information on caring for yourself after your procedure. Your health care provider may also give you more specific instructions. Your treatment has been planned according to current medical practices, but problems sometimes occur. Call your health care provider if you have any problems or questions after your procedure. WHAT TO EXPECT AFTER THE PROCEDURE After the procedure, it is typical to experience:  Sleepiness.  Nausea and vomiting. HOME CARE INSTRUCTIONS  For the first 24 hours after general anesthesia:  Have a responsible person with you.  Do not drive a car. If you are alone, do not take public transportation.  Do not drink alcohol.  Do not take medicine that has not been prescribed by your health care provider.  Do not sign important papers or make important decisions.  You may resume a normal diet and activities as directed by your health care provider.  Change bandages (dressings) as directed.  If you have questions or problems that seem related to general anesthesia, call the hospital and ask for the anesthetist or anesthesiologist on call. SEEK MEDICAL CARE IF:  You have nausea and vomiting that continue the day after anesthesia.  You develop a rash. SEEK IMMEDIATE MEDICAL CARE IF:   You have difficulty breathing.  You have chest pain.  You have any allergic problems.   This information is not intended to replace advice given to you by your health care provider. Make  sure you discuss any questions you have with your health care provider.   Document Released: 06/16/2000 Document Revised: 03/31/2014 Document Reviewed: 07/09/2011 Elsevier Interactive Patient Education [employer]

## 2015-04-17 NOTE — Anesthesia Preprocedure Evaluation (Signed)
Anesthesia Evaluation  Patient identified by MRN, date of birth, ID band Patient awake    Reviewed: Allergy & Precautions, H&P , NPO status , Patient's Chart, lab work & pertinent test results, reviewed documented beta blocker date and time   History of Anesthesia Complications Negative for: history of anesthetic complications  Airway Mallampati: I  TM Distance: >3 FB Neck ROM: full    Dental no notable dental hx. (+) Chipped   Pulmonary neg shortness of breath, neg sleep apnea, neg COPD, neg recent URI, Current Smoker,    Pulmonary exam normal breath sounds clear to auscultation       Cardiovascular Exercise Tolerance: Good negative cardio ROS Normal cardiovascular exam Rhythm:regular Rate:Normal     Neuro/Psych negative neurological ROS  negative psych ROS   GI/Hepatic Neg liver ROS, GERD  ,  Endo/Other  negative endocrine ROS  Renal/GU negative Renal ROS  negative genitourinary   Musculoskeletal   Abdominal   Peds  Hematology negative hematology ROS (+)   Anesthesia Other Findings Past Medical History:   Abnormal cervical Pap smear with positive HPV *                Comment:HPV   Preterm labor                                                Reproductive/Obstetrics negative OB ROS                             Anesthesia Physical Anesthesia Plan  ASA: II  Anesthesia Plan: General   Post-op Pain Management:    Induction:   Airway Management Planned:   Additional Equipment:   Intra-op Plan:   Post-operative Plan:   Informed Consent: I have reviewed the patients History and Physical, chart, labs and discussed the procedure including the risks, benefits and alternatives for the proposed anesthesia with the patient or authorized representative who has indicated his/her understanding and acceptance.   Dental Advisory Given  Plan Discussed with: Anesthesiologist, CRNA and  Surgeon  Anesthesia Plan Comments:         Anesthesia Quick Evaluation

## 2015-04-17 NOTE — Op Note (Signed)
  LEEP PROCEDURE NOTE   PROCEDURE DATE: 04/17/2015  PATIENT:  Annette Cline  30 y.o. female  PRE-OPERATIVE DIAGNOSIS:  CIN 3  POST-OPERATIVE DIAGNOSIS:  CIN 3  PROCEDURE:  COLD KNIFE CONIZATION OF CERVIX  SURGEON:  Surgeon(s) and Role:    * Sostenes Kauffmann C Malory Spurr, MD - Primary  ANESTHESIA:  General via ET  I/O  Total I/O In: 800 [I.V.:800] Out: 10 [Blood:10]  FINDINGS:  Small uterus, cervix with no Lugol's uptake at 11-12:00, 2:00, and 6-7:00.  SPECIMEN:  1) cone biopsy of cervix 2) additional biopsy at 2:00 3) ECC  COMPLICATIONS: none apparent  DISPOSITION: vital signs stable to PACU   Indication for Surgery: 30 y.o. [id number] with biopsy-proven CIN 3 during pregnancy.  She delivered 02/07/15, and was advised for CKC post-partum for treatment and/or further diagnosis of cancer.   Pap HSIL Colpo Biopsy CIN 3 ECC not done due to pregnancy  Risks, benefits, alternatives, and limitations of procedure explained to patient, including pain, bleeding, infection, failure to remove abnormal tissue and failure to cure dysplasia, need for repeat procedures, damage to pelvic organs, cervical incompetence.  Role of HPV,cervical dysplasia and need for close followup was empasized. Informed written consent was obtained. All questions were answered. Time out performed. Urine pregnancy test was negative.  ??Procedure: The patient was placed in lithotomy position a speculum was placed in the patient's vagina. A grounding pad placed on the patient. Lugol's solution was applied to the cervix and areas of decreased uptake were noted around the transformation zone, see above.  10ccs of Xylocaine with Epinephrine was injected circumferentially into the stroma of the cervix. A 10-blade was used to excise a cone of tissue ~1.7cm deep and !~2.4cm in diameter. Following this, and ECC was obtained.  Excellent hemostasis was achieved using roller ball coagulation set at 40 Watts coagulation current. Astringyn  solution was then applied and the speculum was removed from the vagina. Specimens were sent to pathology.  All instruments were removed.  Instrument, sponge and needle counts were correct x2.  The patient tolerated the procedure and was brought to PACU in a stable condition.   Post-operative instructions given to patient, including instruction to seek medical attention for persistent bright red bleeding, fever, abdominal/pelvic pain, dysuria, nausea or vomiting. She was also told about the possibility of having black tinged discharge for weeks. She was counseled to avoid anything in the vagina (sex/douching/tampons) for 4 weeks. She has a 4 week post-operative check to assess wound healing, review results and discuss further management.   ----- Ranae Plumber, MD Attending Obstetrician and Gynecologist [facility]

## 2015-04-19 NOTE — Anesthesia Postprocedure Evaluation (Signed)
Anesthesia Post Note  Patient: Annette Cline  Procedure(s) Performed: Procedure(s) (LRB): CONIZATION CERVIX WITH BIOPSY (N/A)  Patient location during evaluation: PACU Anesthesia Type: General Level of consciousness: awake and alert Pain management: pain level controlled Vital Signs Assessment: post-procedure vital signs reviewed and stable Respiratory status: spontaneous breathing, nonlabored ventilation, respiratory function stable and patient connected to nasal cannula oxygen Cardiovascular status: blood pressure returned to baseline and stable Postop Assessment: no signs of nausea or vomiting Anesthetic complications: no    Last Vitals:  Filed Vitals:   04/17/15 1357 04/17/15 1417  BP: 130/78 140/70  Pulse: 48 52  Temp: 36.7 C   Resp: 16 16    Last Pain:  Filed Vitals:   04/17/15 1417  PainSc: 0-No pain                 Lenard Simmer

## 2015-04-20 LAB — SURGICAL PATHOLOGY

## 2016-02-04 ENCOUNTER — Encounter: Payer: Self-pay | Admitting: *Deleted

## 2016-02-04 ENCOUNTER — Inpatient Hospital Stay
Admission: EM | Admit: 2016-02-04 | Discharge: 2016-02-04 | Disposition: A | Discharge status: Home / Self Care | Payer: Medicaid Other | Attending: Advanced Practice Midwife | Admitting: Advanced Practice Midwife

## 2016-02-04 DIAGNOSIS — Z882 Allergy status to sulfonamides status: Secondary | ICD-10-CM | POA: Diagnosis not present

## 2016-02-04 DIAGNOSIS — O99333 Smoking (tobacco) complicating pregnancy, third trimester: Secondary | ICD-10-CM | POA: Diagnosis not present

## 2016-02-04 DIAGNOSIS — F17210 Nicotine dependence, cigarettes, uncomplicated: Secondary | ICD-10-CM | POA: Insufficient documentation

## 2016-02-04 DIAGNOSIS — Z3A37 37 weeks gestation of pregnancy: Secondary | ICD-10-CM | POA: Insufficient documentation

## 2016-02-04 LAB — URINE DRUG SCREEN, QUALITATIVE (ARMC ONLY)
AMPHETAMINES, UR SCREEN: NOT DETECTED
Barbiturates, Ur Screen: NOT DETECTED
Benzodiazepine, Ur Scrn: NOT DETECTED
Cannabinoid 50 Ng, Ur ~~LOC~~: NOT DETECTED
Cocaine Metabolite,Ur ~~LOC~~: NOT DETECTED
MDMA (ECSTASY) UR SCREEN: NOT DETECTED
Methadone Scn, Ur: NOT DETECTED
Opiate, Ur Screen: NOT DETECTED
PHENCYCLIDINE (PCP) UR S: NOT DETECTED
TRICYCLIC, UR SCREEN: NOT DETECTED

## 2016-02-04 LAB — CHLAMYDIA/NGC RT PCR (ARMC ONLY)
Chlamydia Tr: NOT DETECTED
N GONORRHOEAE: NOT DETECTED

## 2016-02-04 LAB — TYPE AND SCREEN
ABO/RH(D): A POS
Antibody Screen: NEGATIVE

## 2016-02-04 LAB — HEPATITIS B SURFACE ANTIGEN: Hepatitis B Surface Ag: NEGATIVE

## 2016-02-04 NOTE — Discharge Summary (Signed)
Physician Final Progress Note  Patient ID: Annette Cline A Group MRN: [id number] DOB/AGE: 30/06/1985 30 y.o.  Admit date: 02/04/2016 Admitting provider: Vena AustriaAndreas Staebler, MD Discharge date: 02/04/2016   Admission Diagnoses:  G5P4 at 2761w6d based on LMP with limited prenatal care admitted for c/o contractions. Pt admits positive fetal movement. She denies LOF/VB. Pt was seen at a clinic but does not know the name. She will attempt to get records.  Discharge Diagnoses:  [name] at 3661w6d with reactive NST, not in labor  Hospital course: Pt was admitted for observation, placed on monitors, labs drawn, cervical exam  Past Medical History:  Diagnosis Date  . 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 Preterm labor     Past Surgical History:  Procedure Laterality Date  . CERVICAL CONIZATION W/BX N/A 04/17/2015   Procedure: CONIZATION CERVIX WITH BIOPSY;  Surgeon: Elenora Fenderhelsea C Ward, MD;  Location: [facility] ORS;  Service: Gynecology;  Laterality: N/A;  . TONSILLECTOMY      No current facility-administered medications on file prior to encounter.    No current outpatient prescriptions on file prior to encounter.    Allergies  Allergen Reactions  . Sulfa Antibiotics Rash    Social History   Social History  . Marital status: Single    Spouse name: N/A  . Number of children: N/A  . Years of education: N/A   Occupational History  . Not on file.   Social History Main Topics  . Smoking status: Current Every Day Smoker    Packs/day: 0.10    Types: Cigarettes  . Smokeless tobacco: Not on file     Comment: smoked 1 PPD prior to pregnancy and 2 cigs/day with pregnancy  . Alcohol use No  . Drug use: No  . Sexual activity: Yes   Other Topics Concern  . Not on file   Social History Narrative  . No narrative on file    Physical Exam: BP 111/77   Pulse 86   Temp 98.2 F (36.8 C) (Oral)   Resp 18   Ht 5\' 5"  (1.651 m)   Wt 185 lb (83.9 kg)   LMP 04/16/2015 (Exact  Date)   BMI 30.79 kg/m   Gen: NAD CV: RRR Pulm: CTAB Pelvic: closed/posterior/0  History of LEEP after last baby Ext: no evidence of DVT Toco: every 10-12 minutes Fetal Well Being: 140 bpm, moderate variability, +accelerations, -decelerations  Consults: None  Significant Findings/ Diagnostic Studies: labs:   Results for Annette LeekMORTON, Jonisha A (MRN [id number]) as of 02/04/2016 09:53  Ref. Range 02/04/2016 07:57 02/04/2016 08:21  Sample Expiration Unknown  02/07/2016  Antibody Screen Unknown  NEG  ABO/RH(D) Unknown  A POS  Amphetamines, Ur Screen Latest Ref Range: NONE DETECTED  NONE DETECTED   Barbiturates, Ur Screen Latest Ref Range: NONE DETECTED  NONE DETECTED   Benzodiazepine, Ur Scrn Latest Ref Range: NONE DETECTED  NONE DETECTED   Cocaine Metabolite,Ur [place] Latest Ref Range: NONE DETECTED  NONE DETECTED   Methadone Scn, Ur Latest Ref Range: NONE DETECTED  NONE DETECTED   MDMA (Ecstasy)Ur Screen Latest Ref Range: NONE DETECTED  NONE DETECTED   Cannabinoid 50 Ng, Ur [place] Latest Ref Range: NONE DETECTED  NONE DETECTED   Opiate, Ur Screen Latest Ref Range: NONE DETECTED  NONE DETECTED   Phencyclidine (PCP) Ur S Latest Ref Range: NONE DETECTED  NONE DETECTED   Tricyclic, Ur Screen Latest Ref Range: NONE DETECTED  NONE DETECTED   CULTURE, BETA  STREP (GROUP B ONLY) Unknown Rpt     Procedures: NST  Discharge Condition: good  Disposition: 01-Home or Self Care  Diet: Regular diet  Discharge Activity: Activity as tolerated Medications: take prenatal vitamin  Discharge Instructions    Discharge activity:  No Restrictions    Complete by:  As directed    Discharge diet:  No restrictions    Complete by:  As directed    Fetal Kick Count:  Lie on our left side for one hour after a meal, and count the number of times your baby kicks.  If it is less than 5 times, get up, move around and drink some juice.  Repeat the test 30 minutes later.  If it is still less than 5 kicks in an hour, notify  your doctor.    Complete by:  As directed    LABOR:  When conractions begin, you should start to time them from the beginning of one contraction to the beginning  of the next.  When contractions are 5 - 10 minutes apart or less and have been regular for at least an hour, you should call your health care provider.    Complete by:  As directed    No sexual activity restrictions    Complete by:  As directed    Notify physician for bleeding from the vagina    Complete by:  As directed    Notify physician for blurring of vision or spots before the eyes    Complete by:  As directed    Notify physician for chills or fever    Complete by:  As directed    Notify physician for fainting spells, "black outs" or loss of consciousness    Complete by:  As directed    Notify physician for increase in vaginal discharge    Complete by:  As directed    Notify physician for leaking of fluid    Complete by:  As directed    Notify physician for pain or burning when urinating    Complete by:  As directed    Notify physician for pelvic pressure (sudden increase)    Complete by:  As directed    Notify physician for severe or continued nausea or vomiting    Complete by:  As directed    Notify physician for sudden gushing of fluid from the vagina (with or without continued leaking)    Complete by:  As directed    Notify physician for sudden, constant, or occasional abdominal pain    Complete by:  As directed    Notify physician if baby moving less than usual    Complete by:  As directed       Follow-up Information    GUTIERREZ, COLLEEN, CNM Follow up.   Specialty:  Certified Nurse Midwife Why:  call [facility] to see if they can see you or refer you  Contact information: [address] [place] [address] [phone]           Total time spent taking care of this patient: 20 minutes  Signed: Tresea MallGLEDHILL,Clinten Howk, CNM  02/04/2016, 9:56 AM

## 2016-02-04 NOTE — OB Triage Note (Signed)
Pt reports having regular u/c's since approx 0300 this night. [place], but no visits the last 2 months [place]. States she has had a cold conization of her cervix this past year  by Dr Elesa MassedWard. States she delivered her last child here under [facility]- precipitous delivery

## 2016-02-05 ENCOUNTER — Encounter: Payer: Self-pay | Admitting: *Deleted

## 2016-02-05 ENCOUNTER — Observation Stay: Payer: Medicaid Other

## 2016-02-05 ENCOUNTER — Observation Stay
Admission: EM | Admit: 2016-02-05 | Discharge: 2016-02-05 | Disposition: A | Discharge status: Home / Self Care | Payer: Medicaid Other | Source: Home / Self Care | Admitting: Obstetrics and Gynecology

## 2016-02-05 DIAGNOSIS — Z3A38 38 weeks gestation of pregnancy: Secondary | ICD-10-CM | POA: Insufficient documentation

## 2016-02-05 DIAGNOSIS — O471 False labor at or after 37 completed weeks of gestation: Secondary | ICD-10-CM

## 2016-02-05 DIAGNOSIS — O0933 Supervision of pregnancy with insufficient antenatal care, third trimester: Secondary | ICD-10-CM

## 2016-02-05 LAB — RUBELLA SCREEN: Rubella: 1.87 index (ref >0.99)

## 2016-02-05 LAB — VARICELLA ZOSTER ANTIBODY, IGG: Varicella IgG: 1634 index (ref >165)

## 2016-02-05 LAB — RPR: RPR Ser Ql: NONREACTIVE

## 2016-02-05 LAB — HIV ANTIBODY (ROUTINE TESTING W REFLEX): HIV Screen 4th Generation wRfx: NONREACTIVE

## 2016-02-05 LAB — OB RESULTS CONSOLE GBS: STREP GROUP B AG: NEGATIVE

## 2016-02-05 MED ORDER — MORPHINE SULFATE (PF) 4 MG/ML IV SOLN
INTRAVENOUS | Status: AC
  Filled 2016-02-05: qty 1

## 2016-02-05 MED ORDER — MORPHINE SULFATE (PF) 4 MG/ML IV SOLN
4.0000 mg | Freq: Once | INTRAVENOUS | Status: AC
  Administered 2016-02-05: 4 mg via INTRAMUSCULAR
  Filled 2016-02-05: qty 1

## 2016-02-05 NOTE — Final Progress Note (Signed)
Physician Final Progress Note  Patient ID: Annette Cline MRN: [id number] DOB/AGE: 30/06/1985 30 y.o.  Admit date: 02/05/2016 Admitting provider: Vena AustriaAndreas Pardeep Pautz, MD Discharge date: 02/05/2016   Admission Diagnoses: Braxton-Hicks Contractions  Discharge Diagnoses:  Active Problems:   Labor and delivery indication for care or intervention   Consults: None  Significant Findings/ Diagnostic Studies:  No results found for this or any previous visit (from the past 24 hour(s)).  [place] Ob Comp + 14 Wk  Result Date: 02/05/2016 CLINICAL DATA:  No prenatal care. Third trimester pregnancy. Unknown LMP. EXAM: OBSTETRICAL ULTRASOUND >14 WKS FINDINGS: Number of Fetuses: 1 Heart Rate:  137 bpm Movement: Yes Presentation: Cephalic Previa: No Placental Location: Anterior Amniotic Fluid (Subjective): Within normal limits Amniotic Fluid (Objective): AFI 10.6 cm (5%ile= 7.7 cm, 95%= 24.9 cm for 36 wks) FETAL BIOMETRY BPD:  8.8cm 35w 4d HC:    32.1cm  36w   1d AC:   32.5cm  36w   3d FL:   7.1cm  36w   2d Current Mean GA: 36w 1d              US EDC: 03/03/2016 Estimated Fetal Weight:  2,888g FETAL ANATOMY Lateral Ventricles: Appears normal Thalami/CSP: Appears normal Posterior Fossa:  Not visualized Nuchal Region: Not visualized Upper Lip: Appears normal Spine: Limited views appear normal 4 Chamber Heart on Left: Appears normal LVOT: Appears normal RVOT: Appears normal Stomach on Left: Appears normal 3 Vessel Cord: Appears normal Cord Insertion site: Not visualized Kidneys: Appears normal Bladder: Appears normal Extremities: Appears normal Sex: Female Technically difficult due to: Advanced gestational age and fetal position Maternal Findings: Cervix:  Not visualized ; >34 weeks IMPRESSION: Single living intrauterine fetus in cephalic presentation. Estimated gestational age is 36 weeks 1 day, with US EDC of 03/03/2016. No fetal anomalies seen involving visualized anatomy noted above. Electronically Signed   By: Myles RosenthalJohn   Stahl M.D.   On: 02/05/2016 11:31   Procedures: NST reactive 130, moderate, +accels, no decels  Discharge Condition: good  Disposition: 01-Home or Self Care  Diet: Regular diet  Discharge Activity: Activity as tolerated  Discharge Instructions    Discharge activity:  No Restrictions    Complete by:  As directed    Discharge diet:  No restrictions    Complete by:  As directed    Fetal Kick Count:  Lie on our left side for one hour after a meal, and count the number of times your baby kicks.  If it is less than 5 times, get up, move around and drink some juice.  Repeat the test 30 minutes later.  If it is still less than 5 kicks in an hour, notify your doctor.    Complete by:  As directed    LABOR:  When conractions begin, you should start to time them from the beginning of one contraction to the beginning  of the next.  When contractions are 5 - 10 minutes apart or less and have been regular for at least an hour, you should call your health care provider.    Complete by:  As directed    No sexual activity restrictions    Complete by:  As directed    Notify physician for bleeding from the vagina    Complete by:  As directed    Notify physician for blurring of vision or spots before the eyes    Complete by:  As directed    Notify physician for chills or fever    Complete  by:  As directed    Notify physician for fainting spells, "black outs" or loss of consciousness    Complete by:  As directed    Notify physician for increase in vaginal discharge    Complete by:  As directed    Notify physician for leaking of fluid    Complete by:  As directed    Notify physician for pain or burning when urinating    Complete by:  As directed    Notify physician for pelvic pressure (sudden increase)    Complete by:  As directed    Notify physician for severe or continued nausea or vomiting    Complete by:  As directed    Notify physician for sudden gushing of fluid from the vagina (with or  without continued leaking)    Complete by:  As directed    Notify physician for sudden, constant, or occasional abdominal pain    Complete by:  As directed    Notify physician if baby moving less than usual    Complete by:  As directed        Medication List    TAKE these medications   multivitamin-prenatal 27-0.8 MG Tabs tablet Take 1 tablet by mouth daily at 12 noon.      Follow-up Information    [facility] Follow up in 1 week(s).   Contact information: [address] [place] [address] [phone]           Total time spent taking care of this patient: 60 minutes  Signed: Lorrene ReidSTAEBLER, Libero Puthoff M 02/05/2016, 12:37 PM

## 2016-02-05 NOTE — Discharge Summary (Signed)
See final progress note. 

## 2016-02-05 NOTE — OB Triage Note (Signed)
[id number] presents at 38w c/o contractions since 0200. No gush of fluid. Bloody show. +FM.

## 2016-02-05 NOTE — Progress Notes (Signed)
Pt is having contractions every 7-10 minutes which palpate moderate. Fetal Well Being: 135 bpm, moderate variability, + accelerations, -decelerations. Pt reports positive fetal movement. She denies LOF, VB.  Cervical exam: cannot reach cervix due to posterior position/fetal head is +2 station. Cephalic sutures felt through vaginal wall.  Discussed plan of care with pt that due to conization procedure she may have scar tissue on the cervix that is preventing or delaying dilation. Report given to incoming team and will start IV.   Tresea MallGLEDHILL,Jaziel Bennett, CNM

## 2016-02-05 NOTE — Discharge Summary (Signed)
Pt d/c'd home in stable condition. Given labor precautions, verbalized understanding. Was visibly upset upon d/c, anxious about going home in pain. Given instructions about how to deal with pain and contractions, and when to return to the hospital. Verbalized understanding and agreement.

## 2016-02-05 NOTE — Discharge Instructions (Signed)
Drink plenty of fluid, get plenty of rest.

## 2016-02-06 ENCOUNTER — Encounter: Payer: Self-pay | Admitting: Anesthesiology

## 2016-02-06 ENCOUNTER — Inpatient Hospital Stay
Admission: EM | Admit: 2016-02-06 | Discharge: 2016-02-07 | DRG: 775 | Disposition: A | Discharge status: Home / Self Care | Payer: Medicaid Other | Attending: Certified Nurse Midwife | Admitting: Certified Nurse Midwife

## 2016-02-06 DIAGNOSIS — IMO0002 Reserved for concepts with insufficient information to code with codable children: Secondary | ICD-10-CM | POA: Diagnosis present

## 2016-02-06 DIAGNOSIS — O42913 Preterm premature rupture of membranes, unspecified as to length of time between rupture and onset of labor, third trimester: Principal | ICD-10-CM | POA: Diagnosis present

## 2016-02-06 DIAGNOSIS — Z3A36 36 weeks gestation of pregnancy: Secondary | ICD-10-CM

## 2016-02-06 DIAGNOSIS — O0930 Supervision of pregnancy with insufficient antenatal care, unspecified trimester: Secondary | ICD-10-CM

## 2016-02-06 DIAGNOSIS — Z833 Family history of diabetes mellitus: Secondary | ICD-10-CM

## 2016-02-06 DIAGNOSIS — O99334 Smoking (tobacco) complicating childbirth: Secondary | ICD-10-CM | POA: Diagnosis present

## 2016-02-06 DIAGNOSIS — F17210 Nicotine dependence, cigarettes, uncomplicated: Secondary | ICD-10-CM | POA: Diagnosis present

## 2016-02-06 DIAGNOSIS — O0993 Supervision of high risk pregnancy, unspecified, third trimester: Secondary | ICD-10-CM

## 2016-02-06 LAB — CBC
HEMATOCRIT: 36 % (ref 35.0–47.0)
HEMOGLOBIN: 11.9 g/dL — AB (ref 12.0–16.0)
MCH: 28.3 pg (ref 26.0–34.0)
MCHC: 33.2 g/dL (ref 32.0–36.0)
MCV: 85.2 fL (ref 80.0–100.0)
PLATELETS: 309 10*3/uL (ref 150–440)
RBC: 4.22 MIL/uL (ref 3.80–5.20)
RDW: 13.2 % (ref 11.5–14.5)
WBC: 20.9 10*3/uL — AB (ref 3.6–11.0)

## 2016-02-06 LAB — TYPE AND SCREEN
ABO/RH(D): A POS
Antibody Screen: NEGATIVE

## 2016-02-06 LAB — CULTURE, BETA STREP (GROUP B ONLY)

## 2016-02-06 MED ORDER — BENZOCAINE-MENTHOL 20-0.5 % EX AERO
1.0000 "application " | INHALATION_SPRAY | CUTANEOUS | Status: DC | PRN
  Administered 2016-02-06: 1 via TOPICAL
  Filled 2016-02-06 (×2): qty 56

## 2016-02-06 MED ORDER — HYDROMORPHONE HCL 1 MG/ML IJ SOLN
0.5000 mg | INTRAMUSCULAR | Status: AC
  Administered 2016-02-06: 0.5 mg via INTRAVENOUS
  Filled 2016-02-06: qty 1

## 2016-02-06 MED ORDER — DIPHENHYDRAMINE HCL 25 MG PO CAPS: 25.0000 mg | ORAL_CAPSULE | Freq: Four times a day (QID) | ORAL | Status: DC | PRN

## 2016-02-06 MED ORDER — SOD CITRATE-CITRIC ACID 500-334 MG/5ML PO SOLN: 30.0000 mL | ORAL | Status: DC | PRN

## 2016-02-06 MED ORDER — FERROUS SULFATE 325 (65 FE) MG PO TABS
325.0000 mg | ORAL_TABLET | Freq: Two times a day (BID) | ORAL | Status: DC
  Administered 2016-02-07 (×2): 325 mg via ORAL
  Filled 2016-02-06 (×2): qty 1

## 2016-02-06 MED ORDER — LIDOCAINE HCL (PF) 1 % IJ SOLN: 30.0000 mL | INTRAMUSCULAR | Status: DC | PRN

## 2016-02-06 MED ORDER — ACETAMINOPHEN 325 MG PO TABS: 650.0000 mg | ORAL_TABLET | ORAL | Status: DC | PRN

## 2016-02-06 MED ORDER — WITCH HAZEL-GLYCERIN EX PADS: 1.0000 "application " | MEDICATED_PAD | CUTANEOUS | Status: DC | PRN

## 2016-02-06 MED ORDER — FENTANYL CITRATE (PF) 100 MCG/2ML IJ SOLN
25.0000 ug | INTRAMUSCULAR | Status: AC
  Administered 2016-02-06: 25 ug via INTRAVENOUS

## 2016-02-06 MED ORDER — LACTATED RINGERS IV SOLN
500.0000 mL | INTRAVENOUS | Status: DC | PRN
  Administered 2016-02-06: 500 mL via INTRAVENOUS

## 2016-02-06 MED ORDER — PRENATAL MULTIVITAMIN CH
1.0000 | ORAL_TABLET | Freq: Every day | ORAL | Status: DC
  Administered 2016-02-07: 1 via ORAL
  Filled 2016-02-06: qty 1

## 2016-02-06 MED ORDER — DIBUCAINE 1 % RE OINT: 1.0000 "application " | TOPICAL_OINTMENT | RECTAL | Status: DC | PRN

## 2016-02-06 MED ORDER — SENNOSIDES-DOCUSATE SODIUM 8.6-50 MG PO TABS
2.0000 | ORAL_TABLET | ORAL | Status: DC
  Administered 2016-02-07: 2 via ORAL
  Filled 2016-02-06: qty 2

## 2016-02-06 MED ORDER — AMMONIA AROMATIC IN INHA
RESPIRATORY_TRACT | Status: AC
  Filled 2016-02-06: qty 10

## 2016-02-06 MED ORDER — ONDANSETRON HCL 4 MG/2ML IJ SOLN: 4.0000 mg | Freq: Four times a day (QID) | INTRAMUSCULAR | Status: DC | PRN

## 2016-02-06 MED ORDER — ONDANSETRON HCL 4 MG/2ML IJ SOLN: 4.0000 mg | INTRAMUSCULAR | Status: DC | PRN

## 2016-02-06 MED ORDER — FENTANYL CITRATE (PF) 100 MCG/2ML IJ SOLN
INTRAMUSCULAR | Status: AC
  Administered 2016-02-06: 25 ug via INTRAVENOUS
  Filled 2016-02-06: qty 2

## 2016-02-06 MED ORDER — LIDOCAINE HCL (PF) 1 % IJ SOLN
INTRAMUSCULAR | Status: AC
  Filled 2016-02-06: qty 30

## 2016-02-06 MED ORDER — OXYTOCIN 40 UNITS IN LACTATED RINGERS INFUSION - SIMPLE MED
2.5000 [IU]/h | INTRAVENOUS | Status: DC
Start: 2016-02-06 — End: 2016-02-06

## 2016-02-06 MED ORDER — IBUPROFEN 600 MG PO TABS
600.0000 mg | ORAL_TABLET | Freq: Four times a day (QID) | ORAL | Status: DC
  Administered 2016-02-07 (×2): 600 mg via ORAL
  Filled 2016-02-06 (×2): qty 1

## 2016-02-06 MED ORDER — IBUPROFEN 600 MG PO TABS
600.0000 mg | ORAL_TABLET | Freq: Four times a day (QID) | ORAL | Status: DC
  Administered 2016-02-06: 600 mg via ORAL
  Filled 2016-02-06: qty 1

## 2016-02-06 MED ORDER — LACTATED RINGERS IV SOLN
INTRAVENOUS | Status: DC
  Administered 2016-02-06: 16:00:00 via INTRAVENOUS

## 2016-02-06 MED ORDER — COCONUT OIL OIL: 1.0000 "application " | TOPICAL_OIL | Status: DC | PRN

## 2016-02-06 MED ORDER — HYDROCODONE-ACETAMINOPHEN 5-325 MG PO TABS
1.0000 | ORAL_TABLET | ORAL | Status: DC | PRN
  Administered 2016-02-06 – 2016-02-07 (×2): 1 via ORAL
  Filled 2016-02-06 (×2): qty 1

## 2016-02-06 MED ORDER — TERBUTALINE SULFATE 1 MG/ML IJ SOLN
0.2500 mg | Freq: Once | INTRAMUSCULAR | Status: DC | PRN
  Filled 2016-02-06: qty 1

## 2016-02-06 MED ORDER — OXYTOCIN 10 UNIT/ML IJ SOLN: 10.0000 [IU] | Freq: Once | INTRAMUSCULAR | Status: DC

## 2016-02-06 MED ORDER — SIMETHICONE 80 MG PO CHEW: 80.0000 mg | CHEWABLE_TABLET | ORAL | Status: DC | PRN

## 2016-02-06 MED ORDER — OXYTOCIN BOLUS FROM INFUSION
500.0000 mL | Freq: Once | INTRAVENOUS | Status: AC
  Administered 2016-02-06: 500 mL via INTRAVENOUS

## 2016-02-06 MED ORDER — MISOPROSTOL 200 MCG PO TABS
ORAL_TABLET | ORAL | Status: AC
  Filled 2016-02-06: qty 4

## 2016-02-06 MED ORDER — OXYTOCIN 10 UNIT/ML IJ SOLN
INTRAMUSCULAR | Status: AC
  Filled 2016-02-06: qty 2

## 2016-02-06 MED ORDER — ONDANSETRON HCL 4 MG PO TABS: 4.0000 mg | ORAL_TABLET | ORAL | Status: DC | PRN

## 2016-02-06 MED ORDER — OXYTOCIN 40 UNITS IN LACTATED RINGERS INFUSION - SIMPLE MED
1.0000 m[IU]/min | INTRAVENOUS | Status: DC
  Administered 2016-02-06: 1 m[IU]/min via INTRAVENOUS
  Filled 2016-02-06: qty 1000

## 2016-02-06 NOTE — Progress Notes (Signed)
Patient asking about going downstairs "for some fresh air", wanting to "move around". Told patient that it was not medically recommended that she leave the floor being so recently delivered and that I would prefer that she at least until the morning. Told patient that she was free to walk around the room and the unit. Patient stating she "feels like she's on house arrest". Told patient that legally I cannot hold her but that I could not be responsible for her if she left that floor. Educated her on potential complications she could experience including dizziness, lightheadedness, fainting, or bleeding, which I would not be able to care for if she was not on the floor. Patient understands these risks, but is still insisting on going downstairs. Asked patient to please take someone with her, to call for help immediately if she should need it, and to please come back in 10 minutes or less. Patient states that she will do all of the above and will let me know when she goes and as soon as she comes back.   Annette ShellerMegan Nysa Sarin, RN

## 2016-02-06 NOTE — Discharge Summary (Signed)
OB Discharge Summary     Patient Name: Annette Cline DOB: 10/03/1985 MRN: [id number]  Date of admission: 02/06/2016 Delivering MD: Conard NovakJackson, Stephen D, MD  Date of Delivery: 02/06/2016  Date of discharge: 02/07/2016  Admitting diagnosis: 36 wks, water broke Intrauterine pregnancy: 6227w2d    (possibly >40 weeks based on Ballard scoring) Secondary diagnosis: None     Discharge diagnosis: Term Pregnancy Delivered                                                                                                Post partum procedures:None  Augmentation: Pitocin  Complications: None  Hospital course:  Onset of Labor With Vaginal Delivery     30 y.o. yo [phone] at 2427w2d was admitted in Active Labor on 02/06/2016. Patient had an uncomplicated labor course as follows:  Membrane Rupture Time/Date: 8:00 AM ,02/06/2016   Intrapartum Procedures: Episiotomy: None [1]                                         Lacerations:  None [1]  Patient had a delivery of a Viable infant. 02/06/2016  Information for the patient's newborn:  Othella BoyerMorton, Boy Mckensey [[id number]][[id number]]  Delivery Method: Vag-Spont    Pateint had an uncomplicated postpartum course.  She is ambulating, tolerating a regular diet, passing flatus, and urinating well. Patient is discharged home in stable condition on 02/07/16.    Physical exam  Vitals:   02/07/16 0400 02/07/16 0715 02/07/16 1213 02/07/16 1530  BP: (!) 93/46 110/65 101/61 108/68  Pulse: 67 74 74 80  Resp: 20 18 20 18   Temp: 98.4 F (36.9 C) 98 F (36.7 C) 98.4 F (36.9 C) 98.1 F (36.7 C)  TempSrc: Oral Oral Oral Oral  SpO2: 100%     Weight:      Height:       General: alert, cooperative and no distress Lochia: appropriate Uterine Fundus: firm Incision: N/A DVT Evaluation: No evidence of DVT seen on physical exam.  Labs: Lab Results  Component Value Date   WBC 19.3 (H) 02/07/2016   HGB 10.5 (L) 02/07/2016   HCT 31.3 (L) 02/07/2016   MCV 84.6 02/07/2016   PLT  259 02/07/2016   CMP Latest Ref Rng & Units 04/17/2015  Glucose 65 - 99 mg/dL 87  BUN 6 - 20 mg/dL 12  Creatinine 1.300.44 - 8.651.00 mg/dL 7.840.78  Sodium 696135 - 295145 mmol/L 140  Potassium 3.5 - 5.1 mmol/L 3.6  Chloride 101 - 111 mmol/L 110  CO2 22 - 32 mmol/L 25  Calcium 8.9 - 10.3 mg/dL 2.8(U8.7(L)  Total Protein 6.4 - 8.2 g/dL -  Total Bilirubin 0.2 - 1.0 mg/dL -  Alkaline Phos Unit/L -  AST 15 - 37 Unit/L -  ALT U/L -    Discharge instruction: per After Visit Summary.  Medications:    Medication List    TAKE these medications   multivitamin-prenatal 27-0.8 MG Tabs tablet Take 1 tablet by mouth daily at 12 noon.  Diet: routine diet  Activity: Advance as tolerated. Pelvic rest for 6 weeks.   Outpatient follow up: Follow-up Information    Conard NovakJackson, Stephen D, MD Follow up in 6 week(s).   Specialty:  Obstetrics and Gynecology Why:  postpartum follow up visit Contact information: [address] [place] [address] [phone]             Postpartum contraception: Undecided Rhogam Given postpartum: no Rubella vaccine given postpartum: no Varicella vaccine given postpartum: no TDaP given antepartum or postpartum: not AP  Newborn Data: Live born female APGAR: 2, 7, 9   Baby Feeding: Bottle  Disposition:home with mother  SIGNED: Annamarie MajorPaul Harris, MD

## 2016-02-06 NOTE — Progress Notes (Signed)
Patient educated on fall safety and infant safety. Patient questioned "when did yall start doing that" when educated on safe sleep and not sleeping with the baby. Friend of patient in room stated "they just have to tell you that but you can still sleep with him, I slept with mine". Educated patient on SIDS and SIDS risk factors including co-sleeping. Told patient and friend that our protocols were based on research and that I was telling her because I wanted her baby to be safe and alive, not just because I "had to". Further educated patient, friend, and father of the baby on what could potentially happen when sharing the bed with baby including baby falls, suffocation, being rolled over on, or being in a position that occludes baby's airway. Patient accepted the information. Will continue to reinforce education if needed.  Imagene ShellerMegan Gillian Kluever, RN

## 2016-02-06 NOTE — Progress Notes (Signed)
Patient leaving floor at this time, ambulating with friends/family. Will notify me when she returns. Imagene ShellerMegan Sufyan Meidinger, RN

## 2016-02-06 NOTE — H&P (Addendum)
OB History & Physical   History of Present Illness:  Chief Complaint: my water broke  HPI:  Casimer Leekshley A Clarkston is a 30 y.o. [phone] female at 505w2d dated by ultrasound yesterday.  Her pregnancy has been complicated by very limited prenatal care, LEEP procedure this past year.  She also has a history of preterm delivery with one of her pregnancies at 36 weeks of gestation.   She reports contractions on and off for the past three days and she has presented [facility] for the past three days, including today.   She reports leakage of fluid that was clear and was a gush at about 630-700 am this morning.   She denies vaginal bleeding.   She reports fetal movement.    Maternal Medical History:   Past Medical History:  Diagnosis Date  . 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 Preterm labor     Past Surgical History:  Procedure Laterality Date  . CERVICAL CONIZATION W/BX N/A 04/17/2015   Procedure: CONIZATION CERVIX WITH BIOPSY;  Surgeon: Elenora Fenderhelsea C Ward, MD;  Location: [facility] ORS;  Service: Gynecology;  Laterality: N/A;  . TONSILLECTOMY      Allergies  Allergen Reactions  . Sulfa Antibiotics Rash    Prior to Admission medications   Medication Sig Start Date End Date Taking? Authorizing Provider  Prenatal Vit-Fe Fumarate-FA (MULTIVITAMIN-PRENATAL) 27-0.8 MG TABS tablet Take 1 tablet by mouth daily at 12 noon.   Yes Historical Provider, MD    OB History  Gravida Para Term Preterm AB Living  8 3 2 1 4 3   SAB TAB Ectopic Multiple Live Births  2 2 0 0 3    # Outcome Date GA Lbr Len/2nd Weight Sex Delivery Anes PTL Lv  8 Current           7 Term 02/07/15 6329w3d  6 lb 7.4 oz (2.93 kg) F Vag-Spont None  LIV  6 Preterm 03/12/11 2572w0d  5 lb 6 oz (2.438 kg) M Vag-Spont   LIV  5 Term 08/26/01 4551w0d  6 lb 8 oz (2.948 kg) F Vag-Spont   LIV  4 TAB           3 TAB           2 SAB           1 SAB               Prenatal care site: None  Social History: She   reports that she has been smoking Cigarettes.  She has been smoking about 0.10 packs per day. She has never used smokeless tobacco. She reports that she does not drink alcohol or use drugs.  Family History: family history includes Cancer in her maternal grandmother and mother; Diabetes in her mother; Hyperlipidemia in her maternal grandmother.   Review of Systems: Negative x 10 systems reviewed except as noted in the HPI.    Physical Exam:  Vital Signs: BP 103/65 (BP Location: Left Arm)   Pulse 93   Temp 98.4 F (36.9 C) (Oral)   Resp 18   Ht 5\' 5"  (1.651 m)   Wt 185 lb (83.9 kg)   LMP 04/16/2015 (Exact Date)   BMI 30.79 kg/m  General: no acute distress.  HEENT: normocephalic, atraumatic Heart: regular rate & rhythm.  No murmurs/rubs/gallops Lungs: clear to auscultation bilaterally Abdomen: soft, gravid, non-tender;  EFW: 6 lb Pelvic: (female chaperone present during pelvic exam)  External: Normal external female genitalia  Cervix: Dilation: 3 / Effacement (%): 100 / Station: -1 (closed-1cm yesterday)  ROM: pos pooling; +nitrazine; gross ruptured of fluid Extremities: non-tender, symmetric, no edema bilaterally.  DTRs: 2+  Neurologic: Alert & oriented x 3.    Pertinent Results:  Prenatal Labs: Blood type/Rh A positive  Antibody screen negative  Rubella Immune  Varicella Immune    RPR NR  HBsAg negative  HIV negative  GC negative  Chlamydia negative  Genetic screening none  1 hour GTT none  3 hour GTT none  GBS Negative within past week   Baseline FHR: 140 beats/min   Variability: moderate   Accelerations: present   Decelerations: present with recurrent variables to 90s that are short duration with quick return to baseline Contractions: present frequency: 2 q 10 min Overall assessment: category 2  Bedside Ultrasound: (report from yesterday)  Number of Fetus: 1  Presentation: cephalic  Fluid: 16XW10cm yesterday  Placental Location: anterior  Assessment:  Casimer Leekshley A  [place] is a 30 y.o. [phone] female at 8766w2d with insufficient prenatal care and grossly ruptured membranes with labor onset prior to presentation.   Plan:  1. Admit to Labor & Delivery  2. CBC, T&S, Clrs, IVF 3. GBS negative 4. Fetwal well-being: reassuring overall. Will institute intrauterine resuscitative measures   Conard NovakJackson, Kamdyn Covel D, MD 02/06/2016 10:22 AM

## 2016-02-06 NOTE — Progress Notes (Signed)
Patient back. Wheeled in wheelchair. Asked patient if anything happened. Per patient she "got a little dizzy". Reinforced previous education about why it was not medically recommended for her to leave floor.  VSS, bleeding WNL. Imagene ShellerMegan Avani Sensabaugh, RN

## 2016-02-06 NOTE — Progress Notes (Signed)
Patient with contractions but no cervical change. Will start pitocin at this time. She has had several variable decelerations, but has a category 1 tracing. Will monitor closely as we augment.  Thomasene MohairStephen Jackson, MD 02/06/2016 3:14 PM

## 2016-02-07 LAB — RPR: RPR: NONREACTIVE

## 2016-02-07 LAB — CBC
HEMATOCRIT: 31.3 % — AB (ref 35.0–47.0)
HEMOGLOBIN: 10.5 g/dL — AB (ref 12.0–16.0)
MCH: 28.4 pg (ref 26.0–34.0)
MCHC: 33.6 g/dL (ref 32.0–36.0)
MCV: 84.6 fL (ref 80.0–100.0)
Platelets: 259 10*3/uL (ref 150–440)
RBC: 3.7 MIL/uL — AB (ref 3.80–5.20)
RDW: 13.3 % (ref 11.5–14.5)
WBC: 19.3 10*3/uL — ABNORMAL HIGH (ref 3.6–11.0)

## 2016-02-07 NOTE — Progress Notes (Signed)
Patient discharged home with infant. Vital signs stable, bleeding within normal limits, uterus firm. Discharge instructions, prescriptions, and follow up appointment given to and reviewed with patient. Patient verbalized understanding, all questions answered. Escorted by nursing, patient ambulatory.  Imagene ShellerMegan Josette Shimabukuro, RN

## 2016-02-07 NOTE — Discharge Instructions (Signed)
Vaginal Delivery, Care After Refer to this sheet in the next few weeks. These instructions provide you with information about caring for yourself after vaginal delivery. Your health care provider may also give you more specific instructions. Your treatment has been planned according to current medical practices, but problems sometimes occur. Call your health care provider if you have any problems or questions. What can I expect after the procedure? After vaginal delivery, it is common to have:  Some bleeding from your vagina.  Soreness in your abdomen, your vagina, and the area of skin between your vaginal opening and your anus (perineum).  Pelvic cramps.  Fatigue. Follow these instructions at home: Medicines  Take over-the-counter and prescription medicines only as told by your health care provider. Driving  Do not drive or operate heavy machinery while taking prescription pain medicine.  Do not drive for 24 hours if you received a sedative. Lifestyle  Do not drink alcohol. This is especially important if you are breastfeeding or taking medicine to relieve pain.  Do not use tobacco products, including cigarettes, chewing tobacco, or e-cigarettes. If you need help quitting, ask your health care provider. Eating and drinking  Drink at least 8 eight-ounce glasses of water every day unless you are told not to by your health care provider. If you choose to breastfeed your baby, you may need to drink more water than this.  Eat high-fiber foods every day. These foods may help prevent or relieve constipation. High-fiber foods include:  Whole grain cereals and breads.  Brown rice.  Beans.  Fresh fruits and vegetables. Activity  Return to your normal activities as told by your health care provider. Ask your health care provider what activities are safe for you.  Rest as much as possible. Try to rest or take a nap when your baby is sleeping.  Do not lift anything that is heavier  than your baby or 10 lb (4.5 kg) until your health care provider says that it is safe.  Talk with your health care provider about when you can engage in sexual activity. This may depend on your:  Risk of infection.  Rate of healing.  Comfort and desire to engage in sexual activity. Vaginal Care  Do not use tampons or douches for the next 6 weeks.   Watch for any blood clots that may pass from your vagina. These may look like clumps of dark red, brown, or black discharge. General instructions  Keep your perineum clean and dry as told by your health care provider.  Wear loose, comfortable clothing.  Wipe from front to back when you use the toilet.  Showers only, no tub baths for the next 6 weeks.   Wear a bra that supports your breasts and fits you well.  If possible, have someone help you with household activities and help care for your baby for at least a few days after you leave the hospital.  Keep all follow-up visits for you and your baby as told by your health care provider. This is important. Contact a health care provider if:  You have:  Vaginal discharge that has a bad smell.  Difficulty urinating.  Pain when urinating.  A sudden increase or decrease in the frequency of your bowel movements.  A fever.  A rash.  Little or no interest in activities you used to enjoy.  Questions about caring for yourself or your baby.  Your breasts are painful, hard, or turn red.  You feel unusually sad or worried.  You feel nauseous or you vomit.  You pass large blood clots from your vagina. If you pass a blood clot from your vagina, save it to show to your health care provider. Do not flush blood clots down the toilet without having your health care provider look at them.  You urinate more than usual.  You are dizzy or light-headed.  You have not breastfed at all and you have not had a menstrual period for 12 weeks after delivery.  You have stopped breastfeeding  and you have not had a menstrual period for 12 weeks after you stopped breastfeeding. Get help right away if:  You have:  Pain that does not go away or does not get better with medicine.  Chest pain.  Difficulty breathing.  Blurred vision or spots in your vision.  Thoughts about hurting yourself or your baby.  You develop pain in your abdomen or in one of your legs.  You develop a severe headache.  You faint.  You bleed from your vagina so much that you fill two sanitary pads in one hour. This information is not intended to replace advice given to you by your health care provider. Make sure you discuss any questions you have with your health care provider. Document Released: 03/07/2000 Document Revised: 08/22/2015 Document Reviewed: 03/25/2015 Elsevier Interactive Patient Education  2017 [employer]

## 2016-02-07 NOTE — Progress Notes (Signed)
Post Partum Day 1 Subjective: no complaints, voiding, tolerating PO and breast feeding Wants to go home today to celebrate her babies 1 year birthday. Going off the floor to smoke.   Objective: Blood pressure 110/65, pulse 74, temperature 98 F (36.7 C), temperature source Oral, resp. rate 18, height 5\' 5"  (1.651 m), weight 83.9 kg (185 lb), last menstrual period 04/16/2015, SpO2 100 %, unknown if currently breastfeeding.  Physical Exam:  General: alert, cooperative and no distress Lochia: appropriate Uterine Fundus: firm/ U-1/ ML/ NT  DVT Evaluation: No evidence of DVT seen on physical exam.   Recent Labs  02/06/16 0930 02/07/16 0513  HGB 11.9* 10.5*  HCT 36.0 31.3*  WBC 20.9* 19.3*  PLT 309 259    Assessment/Plan: Stable PPD #1 Possible discharge this evening if baby discharged. A POS/ RI/ VI/ GBS neg Breast Contraception: undecided   LOS: 1 day   Corin Tilly 02/07/2016, 12:05 PM

## 2016-03-04 ENCOUNTER — Emergency Department
Admission: EM | Admit: 2016-03-04 | Discharge: 2016-03-04 | Disposition: A | Discharge status: Home / Self Care | Payer: Medicaid Other | Attending: Emergency Medicine | Admitting: Emergency Medicine

## 2016-03-04 ENCOUNTER — Emergency Department: Payer: Medicaid Other

## 2016-03-04 ENCOUNTER — Encounter: Payer: Self-pay | Admitting: Emergency Medicine

## 2016-03-04 DIAGNOSIS — Y9389 Activity, other specified: Secondary | ICD-10-CM | POA: Insufficient documentation

## 2016-03-04 DIAGNOSIS — F17210 Nicotine dependence, cigarettes, uncomplicated: Secondary | ICD-10-CM | POA: Insufficient documentation

## 2016-03-04 DIAGNOSIS — S41111A Laceration without foreign body of right upper arm, initial encounter: Secondary | ICD-10-CM

## 2016-03-04 DIAGNOSIS — S41131A Puncture wound without foreign body of right upper arm, initial encounter: Secondary | ICD-10-CM | POA: Diagnosis not present

## 2016-03-04 DIAGNOSIS — T7491XA Unspecified adult maltreatment, confirmed, initial encounter: Secondary | ICD-10-CM

## 2016-03-04 DIAGNOSIS — Y929 Unspecified place or not applicable: Secondary | ICD-10-CM | POA: Diagnosis not present

## 2016-03-04 DIAGNOSIS — Y999 Unspecified external cause status: Secondary | ICD-10-CM | POA: Insufficient documentation

## 2016-03-04 MED ORDER — BACITRACIN ZINC 500 UNIT/GM EX OINT
TOPICAL_OINTMENT | CUTANEOUS | Status: AC
  Filled 2016-03-04: qty 0.9

## 2016-03-04 MED ORDER — LIDOCAINE HCL (PF) 1 % IJ SOLN
5.0000 mL | Freq: Once | INTRAMUSCULAR | Status: AC
  Administered 2016-03-04: 5 mL
  Filled 2016-03-04: qty 5

## 2016-03-04 MED ORDER — TETANUS-DIPHTH-ACELL PERTUSSIS 5-2.5-18.5 LF-MCG/0.5 IM SUSP
0.5000 mL | Freq: Once | INTRAMUSCULAR | Status: DC
  Filled 2016-03-04: qty 0.5
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
## 2016-09-18 ENCOUNTER — Encounter: Payer: Self-pay | Admitting: Emergency Medicine

## 2016-09-18 ENCOUNTER — Emergency Department: Payer: Medicaid Other

## 2016-09-18 ENCOUNTER — Observation Stay
Admission: EM | Admit: 2016-09-18 | Discharge: 2016-09-20 | Disposition: A | Discharge status: Home / Self Care | Payer: Medicaid Other | Attending: General Surgery | Admitting: General Surgery

## 2016-09-18 DIAGNOSIS — F17210 Nicotine dependence, cigarettes, uncomplicated: Secondary | ICD-10-CM | POA: Insufficient documentation

## 2016-09-18 DIAGNOSIS — R1013 Epigastric pain: Secondary | ICD-10-CM | POA: Diagnosis not present

## 2016-09-18 DIAGNOSIS — R109 Unspecified abdominal pain: Secondary | ICD-10-CM | POA: Diagnosis present

## 2016-09-18 DIAGNOSIS — K353 Acute appendicitis with localized peritonitis: Secondary | ICD-10-CM | POA: Diagnosis not present

## 2016-09-18 DIAGNOSIS — K3580 Unspecified acute appendicitis: Secondary | ICD-10-CM

## 2016-09-18 DIAGNOSIS — K219 Gastro-esophageal reflux disease without esophagitis: Secondary | ICD-10-CM | POA: Insufficient documentation

## 2016-09-18 LAB — COMPREHENSIVE METABOLIC PANEL
ALT: 16 U/L (ref 14–54)
ANION GAP: 10 (ref 5–15)
AST: 21 U/L (ref 15–41)
Albumin: 4.5 g/dL (ref 3.5–5.0)
Alkaline Phosphatase: 84 U/L (ref 38–126)
BILIRUBIN TOTAL: 0.5 mg/dL (ref 0.3–1.2)
BUN: 12 mg/dL (ref 6–20)
CHLORIDE: 107 mmol/L (ref 101–111)
CO2: 22 mmol/L (ref 22–32)
Calcium: 9.5 mg/dL (ref 8.9–10.3)
Creatinine, Ser: 0.85 mg/dL (ref 0.44–1.00)
Glucose, Bld: 121 mg/dL — ABNORMAL HIGH (ref 65–99)
POTASSIUM: 3 mmol/L — AB (ref 3.5–5.1)
Sodium: 139 mmol/L (ref 135–145)
TOTAL PROTEIN: 8 g/dL (ref 6.5–8.1)

## 2016-09-18 LAB — CBC
HEMATOCRIT: 40.9 % (ref 35.0–47.0)
Hemoglobin: 13.7 g/dL (ref 12.0–16.0)
MCH: 27.4 pg (ref 26.0–34.0)
MCHC: 33.4 g/dL (ref 32.0–36.0)
MCV: 82 fL (ref 80.0–100.0)
Platelets: 338 10*3/uL (ref 150–440)
RBC: 4.99 MIL/uL (ref 3.80–5.20)
RDW: 13.5 % (ref 11.5–14.5)
WBC: 20.8 10*3/uL — AB (ref 3.6–11.0)

## 2016-09-18 LAB — GLUCOSE, CAPILLARY: Glucose-Capillary: 126 mg/dL — ABNORMAL HIGH (ref 65–99)

## 2016-09-18 LAB — HCG, QUANTITATIVE, PREGNANCY: hCG, Beta Chain, Quant, S: <1 m[IU]/mL (ref <5)

## 2016-09-18 LAB — LIPASE, BLOOD: LIPASE: 22 U/L (ref 11–51)

## 2016-09-18 MED ORDER — PIPERACILLIN-TAZOBACTAM 3.375 G IVPB 30 MIN
3.3750 g | Freq: Once | INTRAVENOUS | Status: AC
  Administered 2016-09-18: 3.375 g via INTRAVENOUS
  Filled 2016-09-18 (×2): qty 50

## 2016-09-18 MED ORDER — SODIUM CHLORIDE 0.9 % IV BOLUS (SEPSIS)
1000.0000 mL | Freq: Once | INTRAVENOUS | Status: AC
  Administered 2016-09-18: 1000 mL via INTRAVENOUS

## 2016-09-18 MED ORDER — MORPHINE SULFATE (PF) 4 MG/ML IV SOLN
4.0000 mg | Freq: Once | INTRAVENOUS | Status: AC
  Administered 2016-09-18: 4 mg via INTRAVENOUS
  Filled 2016-09-18: qty 1

## 2016-09-18 MED ORDER — ONDANSETRON HCL 4 MG/2ML IJ SOLN
4.0000 mg | Freq: Once | INTRAMUSCULAR | Status: AC
  Administered 2016-09-18: 4 mg via INTRAVENOUS

## 2016-09-18 MED ORDER — FENTANYL CITRATE (PF) 100 MCG/2ML IJ SOLN
50.0000 ug | INTRAMUSCULAR | Status: AC | PRN
  Administered 2016-09-18 (×2): 50 ug via INTRAVENOUS
  Filled 2016-09-18 (×2): qty 2

## 2016-09-18 MED ORDER — ONDANSETRON HCL 4 MG/2ML IJ SOLN
INTRAMUSCULAR | Status: AC
  Administered 2016-09-18: 4 mg via INTRAVENOUS
  Filled 2016-09-18: qty 2

## 2016-09-18 NOTE — ED Triage Notes (Signed)
Patient presents to the ED with right upper quadrant pain, epigastric pain and back pain that began a few hours ago.  Patient is uncomfortable and diaphoretic.  Patient reports feeling light headed.  Patient states, "I thought I was hungry and then I ate and then I felt a lot worse and I threw up."  Patient is having difficulty sitting still.  Patient is tearful during triage.

## 2016-09-18 NOTE — ED Notes (Signed)
Patient transported to X-ray 

## 2016-09-18 NOTE — H&P (Signed)
Patient ID: Annette Cline, female   DOB: 1985-07-10, 31 y.o.   MRN: [id number]  CC: Abdominal pain  HPI Annette Cline is a 31 y.o. female who presents emergency department today with a one-day history of abdominal pain. Patient reports that earlier today she developed midepigastric to right upper quadrant abdominal pain. After the pain started she developed nausea and vomiting which prompted her to seek care in the emergency department. She's never had pain like this before. She states that since coming to the emergency department the pain has subsided somewhat. She attempted numerous things make the pain go away without improvement. This included bowel movement, eating, showering. She denies any fevers, chills, chest pain, shortness of breath, diarrhea, constipation. She has had nausea and vomiting. She is also currently breast-feeding her 68-month-old child. Her last oral intake was just before coming to the emergency department.  HPI  Past Medical History:  Diagnosis Date  . 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 Preterm labor     Past Surgical History:  Procedure Laterality Date  . CERVICAL CONIZATION W/BX N/A 04/17/2015   Procedure: CONIZATION CERVIX WITH BIOPSY;  Surgeon: Elenora Fender Ward, MD;  Location: [facility] ORS;  Service: Gynecology;  Laterality: N/A;  . TONSILLECTOMY      Family History  Problem Relation Age of Onset  . Cancer Mother   . Diabetes Mother   . Cancer Maternal Grandmother   . Hyperlipidemia Maternal Grandmother   No known family history of heart disease  Social History Social History  Substance Use Topics  . Smoking status: Current Every Day Smoker    Packs/day: 1.00    Types: Cigarettes  . Smokeless tobacco: Never Used     Comment: smoked 1 PPD prior to pregnancy and 2 cigs/day with pregnancy  . Alcohol use No    Allergies  Allergen Reactions  . Sulfa Antibiotics Rash    No current facility-administered medications for this  encounter.    Current Outpatient Prescriptions  Medication Sig Dispense Refill  . calcium carbonate (TUMS EX) 750 MG chewable tablet Chew 1 tablet by mouth daily as needed for heartburn.       Review of Systems A Multi-point review of systems was asked and was negative except for the findings documented in the history of present illness  Physical Exam Blood pressure (!) 121/104, pulse 70, temperature 97.5 F (36.4 C), temperature source Oral, resp. rate 18, height 5\' 5"  (1.651 m), weight 86.2 kg (190 lb), SpO2 100 %, currently breastfeeding. CONSTITUTIONAL: No acute distress. EYES: Pupils are equal, round, and reactive to light, Sclera are non-icteric. EARS, NOSE, MOUTH AND THROAT: The oropharynx is clear. The oral mucosa is pink and moist. Hearing is intact to voice. LYMPH NODES:  Lymph nodes in the neck are normal. RESPIRATORY:  Lungs are clear. There is normal respiratory effort, with equal breath sounds bilaterally, and without pathologic use of accessory muscles. CARDIOVASCULAR: Heart is regular without murmurs, gallops, or rubs. GI: The abdomen is large, soft, minimally tender in the midline around the umbilicus without rebound or guarding, and nondistended. There are no palpable masses. There is no hepatosplenomegaly. There are normal bowel sounds in all quadrants. GU: Rectal deferred.   MUSCULOSKELETAL: Normal muscle strength and tone. No cyanosis or edema.   SKIN: Turgor is good and there are no pathologic skin lesions or ulcers. NEUROLOGIC: Motor and sensation is grossly normal. Cranial nerves are grossly intact. PSYCH:  Oriented to  person, place and time. Affect is normal.  Data Reviewed Images and labs reviewed. Labs show a leukocytosis of 20.8 however she has 2 prior lab draws with a leukocytosis over the last 7 months with near identical results. She has profound hypokalemia 3.0 but the remainder of her labs are within normal limits. A right upper quadrant ultrasound does  not show any cholelithiasis or evidence of cholecystitis. CT scan of the abdomen was performed without contrast shows a dilated, thickened appendix concerning for possible appendicitis. I have personally reviewed the patient's imaging, laboratory findings and medical records.    Assessment    Abdominal pain    Plan    31 year old female with abdominal pain and a CT scan was concerning for appendicitis. Her abdominal exam is currently not completely consistent with appendicitis. However given the interpretation by the radiologist and the patient's presentation discussed with the patient the option of coming to the hospital and she elects to proceed with that plan. We'll bring her into the hospital for IV fluids, potassium replacement, IV antibiotics, serial abdominal exams. Discussed that should she continue to have tenderness in the morning she will be offered a laparoscopic appendectomy. However, should her pain be resolved she would be transitioned to oral antibiotics for a course of antibiotics. Patient voiced understanding and seemed to voice and desire to avoid surgery so that she can continue to care for her 4 children.     Time spent with the patient was 50 minutes, with more than 50% of the time spent in face-to-face education, counseling and care coordination.     Ricarda Frameharles Cyara Devoto, MD FACS General Surgeon 09/18/2016, 11:18 PM

## 2016-09-18 NOTE — ED Notes (Signed)
Patient transported to CT 

## 2016-09-18 NOTE — ED Provider Notes (Addendum)
[facility] Emergency Department Provider Note  ____________________________________________   I have reviewed the triage vital signs and the nursing notes.   HISTORY  Chief Complaint Abdominal Pain    HPI Annette Cline is a 31 y.o. female with a history of chronically elevated white count of unclear etiology presents today with epigastric pain associated with nausea and vomiting. Patient states that the pain has been there since earlier today. Gradual onset. No chest pain, does her chronic cough. No shortness of breath. Patient states that she can reproduce the pain by eating or touching her stomach. She has had history of "ulcers" as well as gastric reflux in the past. She has had no lower abdominal discomfort, she denies any fever or chills, she denies any vaginal discharge or vaginal symptoms, patient's very anxious and upset about this event. She has had no abdominal surgeries she can think of at this time. She denies presence event wants to know she might be pregnant.  This pain feels somewhat like her prior gastric reflux but not exactly. She also states that her stools have not been melanotic and she has had no hematemesis.   Past Medical History:  Diagnosis Date  . 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 Preterm labor     Patient Active Problem List   Diagnosis Date Noted  . Indication for care in labor or delivery 02/07/2016  . Insufficient prenatal care 02/06/2016  . Supervision of high-risk pregnancy, third trimester 02/06/2016  . Rupture of membranes with clear amniotic fluid 02/06/2016  . Labor and delivery indication for care or intervention 02/07/2015    Past Surgical History:  Procedure Laterality Date  . CERVICAL CONIZATION W/BX N/A 04/17/2015   Procedure: CONIZATION CERVIX WITH BIOPSY;  Surgeon: Elenora Fender Ward, MD;  Location: [facility] ORS;  Service: Gynecology;  Laterality: N/A;  . TONSILLECTOMY      Prior to  Admission medications   Medication Sig Start Date End Date Taking? Authorizing Provider  calcium carbonate (TUMS EX) 750 MG chewable tablet Chew 1 tablet by mouth daily as needed for heartburn.   Yes [provider]    Allergies Sulfa antibiotics  Family History  Problem Relation Age of Onset  . Cancer Mother   . Diabetes Mother   . Cancer Maternal Grandmother   . Hyperlipidemia Maternal Grandmother     Social History Social History  Substance Use Topics  . Smoking status: Current Every Day Smoker    Packs/day: 1.00    Types: Cigarettes  . Smokeless tobacco: Never Used     Comment: smoked 1 PPD prior to pregnancy and 2 cigs/day with pregnancy  . Alcohol use No    Review of Systems Constitutional: No fever/chills Eyes: No visual changes. ENT: No sore throat. No stiff neck no neck pain Cardiovascular: Denies chest pain. Respiratory: Denies shortness of breath. Gastrointestinal:   Positive vomiting.  No diarrhea.  No constipation. Genitourinary: Negative for dysuria. Musculoskeletal: Negative lower extremity swelling Skin: Negative for rash. Neurological: Negative for severe headaches, focal weakness or numbness.   ____________________________________________   PHYSICAL EXAM:  VITAL SIGNS: ED Triage Vitals  Enc Vitals Group     BP 09/18/16 1711 (!) 121/104     Pulse Rate 09/18/16 1711 70     Resp 09/18/16 1711 18     Temp 09/18/16 1711 97.5 F (36.4 C)     Temp Source 09/18/16 1711 Oral     SpO2 09/18/16  1711 100 %     Weight 09/18/16 1711 190 lb (86.2 kg)     Height 09/18/16 1711 5\' 5"  (1.651 m)     Head Circumference --      Peak Flow --      Pain Score 09/18/16 1710 10     Pain Loc --      Pain Edu? --      Excl. in GC? --     Constitutional: Alert and oriented. Well appearing and in no acute distress. Eyes: Conjunctivae are normal Head: Atraumatic HEENT: No congestion/rhinnorhea. Mucous membranes are moist.  Oropharynx  non-erythematous Neck:   Nontender with no meningismus, no masses, no stridor Cardiovascular: Normal rate, regular rhythm. Grossly normal heart sounds.  Good peripheral circulation. Respiratory: Normal respiratory effort.  No retractions. Lungs CTAB. Abdominal: Soft and Minimal tenderness to palpation in the epigastric region which reproduces her discomfort. No distention. No guarding no rebound Back:  There is no focal tenderness or step off.  there is no midline tenderness there are no lesions noted. there is no CVA tenderness Musculoskeletal: No lower extremity tenderness, no upper extremity tenderness. No joint effusions, no DVT signs strong distal pulses no edema Neurologic:  Normal speech and language. No gross focal neurologic deficits are appreciated.  Skin:  Skin is warm, dry and intact. No rash noted. Psychiatric: Mood and affect are normal. Speech and behavior are normal.  ____________________________________________   LABS (all labs ordered are listed, but only abnormal results are displayed)  Labs Reviewed  COMPREHENSIVE METABOLIC PANEL - Abnormal; Notable for the following:       Result Value   Potassium 3.0 (*)    Glucose, Bld 121 (*)    All other components within normal limits  CBC - Abnormal; Notable for the following:    WBC 20.8 (*)    All other components within normal limits  GLUCOSE, CAPILLARY - Abnormal; Notable for the following:    Glucose-Capillary 126 (*)    All other components within normal limits  LIPASE, BLOOD  HCG, QUANTITATIVE, PREGNANCY  URINALYSIS, COMPLETE (UACMP) WITH MICROSCOPIC   ____________________________________________  EKG  I personally interpreted any EKGs ordered by me or triage Normal sinus rhythm, rate 72 bpm no acute ST elevation or depression normal axis ____________________________________________  RADIOLOGY  I reviewed any imaging ordered by me or triage that were performed during my shift and, if possible, patient and/or  family made aware of any abnormal findings. ____________________________________________   PROCEDURES  Procedure(s) performed: None  Procedures  Critical Care performed: None  ____________________________________________   INITIAL IMPRESSION / ASSESSMENT AND PLAN / ED COURSE  Pertinent labs & imaging results that were available during my care of the patient were reviewed by me and considered in my medical decision making (see chart for details).  She reproducible epigastric abdominal pain and vomiting. After told her ultrasound was negative and her blood work is reassuring she stated that her pain was starting "moved down towards her belly button". She still has a completely benign exam and given her high white count, which is difficult to interpret in the context of chronically elevated white blood cells, we will obtain imaging to further delineate her pain. She states her epigastric pain is somewhat radiating towards the right lower quadrant now, I have low suspicion for PID given epigastric abdominal pain and vomiting. No actual vomiting in the emergency department vital signs are reassuring    ____________________________________________   FINAL CLINICAL IMPRESSION(S) / ED DIAGNOSES  Final  diagnoses:  None      This chart was dictated using voice recognition software.  Despite best efforts to proofread,  errors can occur which can change meaning.      Jeanmarie Plant, MD 09/18/16 2046    Jeanmarie Plant, MD 09/18/16 2117

## 2016-09-19 ENCOUNTER — Observation Stay: Payer: Medicaid Other | Admitting: Certified Registered Nurse Anesthetist

## 2016-09-19 ENCOUNTER — Encounter
Admission: EM | Disposition: A | Discharge status: Home / Self Care | Payer: Self-pay | Source: Home / Self Care | Attending: Emergency Medicine

## 2016-09-19 DIAGNOSIS — K3580 Unspecified acute appendicitis: Secondary | ICD-10-CM

## 2016-09-19 HISTORY — PX: LAPAROSCOPIC APPENDECTOMY: SHX408

## 2016-09-19 LAB — MRSA PCR SCREENING: MRSA BY PCR: NEGATIVE

## 2016-09-19 LAB — COMPREHENSIVE METABOLIC PANEL
ALBUMIN: 3.7 g/dL (ref 3.5–5.0)
ALK PHOS: 69 U/L (ref 38–126)
ALT: 13 U/L — ABNORMAL LOW (ref 14–54)
ANION GAP: 5 (ref 5–15)
AST: 17 U/L (ref 15–41)
BILIRUBIN TOTAL: 0.6 mg/dL (ref 0.3–1.2)
BUN: 11 mg/dL (ref 6–20)
CALCIUM: 8.4 mg/dL — AB (ref 8.9–10.3)
CO2: 26 mmol/L (ref 22–32)
Chloride: 109 mmol/L (ref 101–111)
Creatinine, Ser: 0.84 mg/dL (ref 0.44–1.00)
GFR calc Af Amer: >60 mL/min (ref >60)
GLUCOSE: 116 mg/dL — AB (ref 65–99)
Potassium: 3.3 mmol/L — ABNORMAL LOW (ref 3.5–5.1)
Sodium: 140 mmol/L (ref 135–145)
TOTAL PROTEIN: 6.7 g/dL (ref 6.5–8.1)

## 2016-09-19 LAB — CBC
HCT: 35.5 % (ref 35.0–47.0)
Hemoglobin: 12.1 g/dL (ref 12.0–16.0)
MCH: 27.8 pg (ref 26.0–34.0)
MCHC: 34 g/dL (ref 32.0–36.0)
MCV: 81.7 fL (ref 80.0–100.0)
PLATELETS: 278 10*3/uL (ref 150–440)
RBC: 4.35 MIL/uL (ref 3.80–5.20)
RDW: 13.7 % (ref 11.5–14.5)
WBC: 19.2 10*3/uL — ABNORMAL HIGH (ref 3.6–11.0)

## 2016-09-19 LAB — DIFFERENTIAL
BASOS PCT: 1 %
Basophils Absolute: 0.1 10*3/uL (ref 0–0.1)
EOS ABS: 0.1 10*3/uL (ref 0–0.7)
EOS PCT: 1 %
Lymphocytes Relative: 19 %
Lymphs Abs: 3.6 10*3/uL (ref 1.0–3.6)
MONO ABS: 1.1 10*3/uL — AB (ref 0.2–0.9)
Monocytes Relative: 6 %
Neutro Abs: 13.8 10*3/uL — ABNORMAL HIGH (ref 1.4–6.5)
Neutrophils Relative %: 73 %

## 2016-09-19 LAB — PHOSPHORUS: Phosphorus: 4 mg/dL (ref 2.5–4.6)

## 2016-09-19 LAB — MAGNESIUM: Magnesium: 1.8 mg/dL (ref 1.7–2.4)

## 2016-09-19 SURGERY — APPENDECTOMY, LAPAROSCOPIC
Anesthesia: General | Site: Abdomen | Wound class: Clean

## 2016-09-19 MED ORDER — DIPHENHYDRAMINE HCL 25 MG PO CAPS: 25.0000 mg | ORAL_CAPSULE | Freq: Four times a day (QID) | ORAL | Status: DC | PRN

## 2016-09-19 MED ORDER — BUPIVACAINE-EPINEPHRINE (PF) 0.5% -1:200000 IJ SOLN
INTRAMUSCULAR | Status: AC
  Filled 2016-09-19: qty 30

## 2016-09-19 MED ORDER — PIPERACILLIN-TAZOBACTAM 3.375 G IVPB
3.3750 g | Freq: Three times a day (TID) | INTRAVENOUS | Status: DC
  Administered 2016-09-19 – 2016-09-20 (×4): 3.375 g via INTRAVENOUS
  Filled 2016-09-19 (×7): qty 50

## 2016-09-19 MED ORDER — OXYCODONE HCL 5 MG PO TABS
5.0000 mg | ORAL_TABLET | ORAL | Status: DC | PRN
  Administered 2016-09-19: 10 mg via ORAL
  Administered 2016-09-20: 5 mg via ORAL
  Filled 2016-09-19: qty 1
  Filled 2016-09-19: qty 2

## 2016-09-19 MED ORDER — HYDROMORPHONE HCL 1 MG/ML IJ SOLN
0.5000 mg | INTRAMUSCULAR | Status: DC | PRN
  Administered 2016-09-19: 0.5 mg via INTRAVENOUS
  Filled 2016-09-19: qty 0.5

## 2016-09-19 MED ORDER — SUGAMMADEX SODIUM 500 MG/5ML IV SOLN
INTRAVENOUS | Status: AC
  Filled 2016-09-19: qty 5

## 2016-09-19 MED ORDER — ONDANSETRON HCL 4 MG/2ML IJ SOLN
INTRAMUSCULAR | Status: AC
  Filled 2016-09-19: qty 2

## 2016-09-19 MED ORDER — KCL IN DEXTROSE-NACL 40-5-0.45 MEQ/L-%-% IV SOLN
INTRAVENOUS | Status: DC
  Administered 2016-09-19: 1000 mL via INTRAVENOUS
  Administered 2016-09-19: 23:00:00 via INTRAVENOUS
  Administered 2016-09-19: 1000 mL via INTRAVENOUS
  Administered 2016-09-20: 06:00:00 via INTRAVENOUS
  Filled 2016-09-19 (×6): qty 1000

## 2016-09-19 MED ORDER — NICOTINE 21 MG/24HR TD PT24: 21.0000 mg | MEDICATED_PATCH | Freq: Every day | TRANSDERMAL | Status: DC

## 2016-09-19 MED ORDER — DEXAMETHASONE SODIUM PHOSPHATE 10 MG/ML IJ SOLN
INTRAMUSCULAR | Status: AC
  Filled 2016-09-19: qty 1

## 2016-09-19 MED ORDER — FENTANYL CITRATE (PF) 100 MCG/2ML IJ SOLN
INTRAMUSCULAR | Status: AC
  Filled 2016-09-19: qty 2

## 2016-09-19 MED ORDER — DIPHENHYDRAMINE HCL 50 MG/ML IJ SOLN: 25.0000 mg | Freq: Four times a day (QID) | INTRAMUSCULAR | Status: DC | PRN

## 2016-09-19 MED ORDER — POTASSIUM CHLORIDE 10 MEQ/100ML IV SOLN
10.0000 meq | INTRAVENOUS | Status: AC
  Administered 2016-09-19 (×3): 10 meq via INTRAVENOUS
  Filled 2016-09-19: qty 100

## 2016-09-19 MED ORDER — BUPIVACAINE-EPINEPHRINE (PF) 0.5% -1:200000 IJ SOLN
INTRAMUSCULAR | Status: DC | PRN
  Administered 2016-09-19: 30 mL

## 2016-09-19 MED ORDER — SUGAMMADEX SODIUM 200 MG/2ML IV SOLN
INTRAVENOUS | Status: AC
  Filled 2016-09-19: qty 2

## 2016-09-19 MED ORDER — ONDANSETRON 4 MG PO TBDP: 4.0000 mg | ORAL_TABLET | Freq: Four times a day (QID) | ORAL | Status: DC | PRN

## 2016-09-19 MED ORDER — ONDANSETRON HCL 4 MG/2ML IJ SOLN
INTRAMUSCULAR | Status: DC | PRN
  Administered 2016-09-19: 4 mg via INTRAVENOUS

## 2016-09-19 MED ORDER — PROPOFOL 10 MG/ML IV BOLUS
INTRAVENOUS | Status: AC
  Filled 2016-09-19: qty 20

## 2016-09-19 MED ORDER — SUCCINYLCHOLINE CHLORIDE 20 MG/ML IJ SOLN
INTRAMUSCULAR | Status: DC | PRN
  Administered 2016-09-19: 120 mg via INTRAVENOUS

## 2016-09-19 MED ORDER — PANTOPRAZOLE SODIUM 40 MG IV SOLR
40.0000 mg | Freq: Every day | INTRAVENOUS | Status: DC
  Administered 2016-09-19: 40 mg via INTRAVENOUS
  Filled 2016-09-19: qty 40

## 2016-09-19 MED ORDER — DEXAMETHASONE SODIUM PHOSPHATE 10 MG/ML IJ SOLN
INTRAMUSCULAR | Status: DC | PRN
Start: 2016-09-19 — End: 2016-09-19
  Administered 2016-09-19: 5 mg via INTRAVENOUS

## 2016-09-19 MED ORDER — SUGAMMADEX SODIUM 500 MG/5ML IV SOLN
INTRAVENOUS | Status: DC | PRN
  Administered 2016-09-19: 400 mg via INTRAVENOUS

## 2016-09-19 MED ORDER — HYDRALAZINE HCL 20 MG/ML IJ SOLN: 10.0000 mg | INTRAMUSCULAR | Status: DC | PRN

## 2016-09-19 MED ORDER — FENTANYL CITRATE (PF) 100 MCG/2ML IJ SOLN: 25.0000 ug | INTRAMUSCULAR | Status: DC | PRN

## 2016-09-19 MED ORDER — LIDOCAINE HCL (CARDIAC) 20 MG/ML IV SOLN
INTRAVENOUS | Status: DC | PRN
  Administered 2016-09-19: 60 mg via INTRAVENOUS

## 2016-09-19 MED ORDER — ROCURONIUM BROMIDE 50 MG/5ML IV SOLN
INTRAVENOUS | Status: AC
  Filled 2016-09-19: qty 1

## 2016-09-19 MED ORDER — LACTATED RINGERS IV SOLN
INTRAVENOUS | Status: DC | PRN
  Administered 2016-09-19: 11:00:00 via INTRAVENOUS

## 2016-09-19 MED ORDER — PROPOFOL 10 MG/ML IV BOLUS
INTRAVENOUS | Status: DC | PRN
  Administered 2016-09-19: 20 mg via INTRAVENOUS
  Administered 2016-09-19: 180 mg via INTRAVENOUS

## 2016-09-19 MED ORDER — ONDANSETRON HCL 4 MG/2ML IJ SOLN: 4.0000 mg | Freq: Four times a day (QID) | INTRAMUSCULAR | Status: DC | PRN

## 2016-09-19 MED ORDER — ACETAMINOPHEN 10 MG/ML IV SOLN
INTRAVENOUS | Status: DC | PRN
  Administered 2016-09-19: 1000 mg via INTRAVENOUS

## 2016-09-19 MED ORDER — MORPHINE SULFATE (PF) 4 MG/ML IV SOLN: 4.0000 mg | INTRAVENOUS | Status: DC | PRN

## 2016-09-19 MED ORDER — MIDAZOLAM HCL 2 MG/2ML IJ SOLN
INTRAMUSCULAR | Status: AC
  Filled 2016-09-19: qty 2

## 2016-09-19 MED ORDER — ACETAMINOPHEN 10 MG/ML IV SOLN
INTRAVENOUS | Status: AC
  Filled 2016-09-19: qty 100

## 2016-09-19 MED ORDER — KETOROLAC TROMETHAMINE 30 MG/ML IJ SOLN
INTRAMUSCULAR | Status: DC | PRN
  Administered 2016-09-19: 30 mg via INTRAVENOUS

## 2016-09-19 MED ORDER — LIDOCAINE HCL (PF) 2 % IJ SOLN
INTRAMUSCULAR | Status: AC
  Filled 2016-09-19: qty 2

## 2016-09-19 MED ORDER — KCL IN DEXTROSE-NACL 20-5-0.45 MEQ/L-%-% IV SOLN
INTRAVENOUS | Status: DC
  Administered 2016-09-19: 02:00:00 via INTRAVENOUS
  Filled 2016-09-19 (×4): qty 1000

## 2016-09-19 MED ORDER — ROCURONIUM BROMIDE 100 MG/10ML IV SOLN
INTRAVENOUS | Status: DC | PRN
  Administered 2016-09-19: 5 mg via INTRAVENOUS
  Administered 2016-09-19: 25 mg via INTRAVENOUS
  Administered 2016-09-19: 20 mg via INTRAVENOUS

## 2016-09-19 MED ORDER — FENTANYL CITRATE (PF) 100 MCG/2ML IJ SOLN
INTRAMUSCULAR | Status: DC | PRN
  Administered 2016-09-19: 50 ug via INTRAVENOUS
  Administered 2016-09-19: 100 ug via INTRAVENOUS
  Administered 2016-09-19: 50 ug via INTRAVENOUS

## 2016-09-19 MED ORDER — KETOROLAC TROMETHAMINE 30 MG/ML IJ SOLN
30.0000 mg | Freq: Four times a day (QID) | INTRAMUSCULAR | Status: DC
  Administered 2016-09-19 – 2016-09-20 (×4): 30 mg via INTRAVENOUS
  Filled 2016-09-19 (×3): qty 1

## 2016-09-19 MED ORDER — MIDAZOLAM HCL 5 MG/5ML IJ SOLN
INTRAMUSCULAR | Status: DC | PRN
  Administered 2016-09-19: 2 mg via INTRAVENOUS

## 2016-09-19 MED ORDER — ONDANSETRON HCL 4 MG/2ML IJ SOLN: 4.0000 mg | Freq: Once | INTRAMUSCULAR | Status: DC | PRN

## 2016-09-19 SURGICAL SUPPLY — 37 items
CANISTER SUCT 1200ML W/VALVE (MISCELLANEOUS) ×3 IMPLANT
CHLORAPREP W/TINT 26ML (MISCELLANEOUS) ×3 IMPLANT
CUTTER FLEX LINEAR 45M (STAPLE) IMPLANT
DERMABOND ADVANCED (GAUZE/BANDAGES/DRESSINGS) ×2
DERMABOND ADVANCED .7 DNX12 (GAUZE/BANDAGES/DRESSINGS) ×1 IMPLANT
ELECT CAUTERY BLADE 6.4 (BLADE) ×3 IMPLANT
ELECT REM PT RETURN 9FT ADLT (ELECTROSURGICAL) ×3
ELECTRODE REM PT RTRN 9FT ADLT (ELECTROSURGICAL) ×1 IMPLANT
ENDOPOUCH RETRIEVER 10 (MISCELLANEOUS) ×3 IMPLANT
GLOVE SURG SYN 7.0 (GLOVE) ×3 IMPLANT
GLOVE SURG SYN 7.5  E (GLOVE) ×2
GLOVE SURG SYN 7.5 E (GLOVE) ×1 IMPLANT
GOWN STRL REUS W/ TWL LRG LVL3 (GOWN DISPOSABLE) ×2 IMPLANT
GOWN STRL REUS W/TWL LRG LVL3 (GOWN DISPOSABLE) ×4
IRRIGATION STRYKERFLOW (MISCELLANEOUS) ×1 IMPLANT
IRRIGATOR STRYKERFLOW (MISCELLANEOUS) ×3
IV NS 1000ML (IV SOLUTION) ×2
IV NS 1000ML BAXH (IV SOLUTION) ×1 IMPLANT
KIT RM TURNOVER STRD PROC AR (KITS) ×3 IMPLANT
LABEL OR SOLS (LABEL) ×3 IMPLANT
LIGASURE LAP MARYLAND 5MM 37CM (ELECTROSURGICAL) ×3 IMPLANT
NEEDLE HYPO 22GX1.5 SAFETY (NEEDLE) ×3 IMPLANT
NS IRRIG 500ML POUR BTL (IV SOLUTION) ×3 IMPLANT
PACK LAP CHOLECYSTECTOMY (MISCELLANEOUS) ×3 IMPLANT
PENCIL ELECTRO HAND CTR (MISCELLANEOUS) ×3 IMPLANT
RELOAD 45 VASCULAR/THIN (ENDOMECHANICALS) IMPLANT
RELOAD STAPLE TA45 3.5 REG BLU (ENDOMECHANICALS) ×3 IMPLANT
SCISSORS METZENBAUM CVD 33 (INSTRUMENTS) ×3 IMPLANT
SLEEVE ADV FIXATION 5X100MM (TROCAR) ×6 IMPLANT
SUT MNCRL 4-0 (SUTURE) ×2
SUT MNCRL 4-0 27XMFL (SUTURE) ×1
SUT VICRYL 0 AB UR-6 (SUTURE) ×3 IMPLANT
SUTURE MNCRL 4-0 27XMF (SUTURE) ×1 IMPLANT
TRAY FOLEY W/METER SILVER 16FR (SET/KITS/TRAYS/PACK) ×3 IMPLANT
TROCAR 130MM GELPORT  DAV (MISCELLANEOUS) ×3 IMPLANT
TROCAR Z-THREAD OPTICAL 5X100M (TROCAR) ×3 IMPLANT
TUBING INSUFFLATOR HI FLOW (MISCELLANEOUS) ×3 IMPLANT

## 2016-09-19 NOTE — Anesthesia Postprocedure Evaluation (Signed)
Anesthesia Post Note  Patient: Annette Cline  Procedure(s) Performed: Procedure(s) (LRB): APPENDECTOMY LAPAROSCOPIC (N/A)  Patient location during evaluation: PACU Anesthesia Type: General Level of consciousness: awake and alert and oriented Pain management: pain level controlled Vital Signs Assessment: post-procedure vital signs reviewed and stable Respiratory status: spontaneous breathing Cardiovascular status: blood pressure returned to baseline Anesthetic complications: no     Last Vitals:  Vitals:   09/19/16 1448 09/19/16 1545  BP: (!) 99/58 (!) 99/52  Pulse: (!) 56 (!) 57  Resp: 18 18  Temp: 36.5 C 36.8 C    Last Pain:  Vitals:   09/19/16 1545  TempSrc: Oral  PainSc:                  [name]

## 2016-09-19 NOTE — Lactation Note (Signed)
Lactation Consultation Note  Patient Name: Annette Cline [id number] Date: 09/19/2016     Maternal Data  Mom hospitalized with appendicitis, nursing an 3 mth old baby, needs to pump breasts tonight while baby with father, given Medela symphony pump with breast pump kit to pump breasts, pump set up and pt shown how to use, informed that meds given will be okay for 60 mth old baby , she does not need to pump and dump  Feeding    [facility] Score/Interventions                      Lactation Tools Discussed/Used     Consult Status      Ferol Luz 09/19/2016, 6:36 PM

## 2016-09-19 NOTE — Progress Notes (Signed)
Patient in surgery, signed consent for appendectomy

## 2016-09-19 NOTE — Anesthesia Post-op Follow-up Note (Cosign Needed)
Anesthesia QCDR form completed.        

## 2016-09-19 NOTE — Transfer of Care (Signed)
Immediate Anesthesia Transfer of Care Note  Patient: Annette Cline  Procedure(s) Performed: Procedure(s): APPENDECTOMY LAPAROSCOPIC (N/A)  Patient Location: PACU  Anesthesia Type:General  Level of Consciousness: sedated  Airway & Oxygen Therapy: Patient Spontanous Breathing and Patient connected to face mask oxygen  Post-op Assessment: Report given to RN and Post -op Vital signs reviewed and stable  Post vital signs: Reviewed and stable  Last Vitals:  Vitals:   09/19/16 1107 09/19/16 1254  BP: 97/60 (!) 94/55  Pulse: (!) 52 62  Resp: 16 10  Temp: (!) 35.9 C 36.1 C    Last Pain:  Vitals:   09/19/16 1254  TempSrc:   PainSc: Asleep      Patients Stated Pain Goal: 2 (09/19/16 1107)  Complications: No apparent anesthesia complications

## 2016-09-19 NOTE — Anesthesia Preprocedure Evaluation (Signed)
Anesthesia Evaluation  Patient identified by MRN, date of birth, ID band Patient awake    Reviewed: Allergy & Precautions, H&P , NPO status , Patient's Chart, lab work & pertinent test results, reviewed documented beta blocker date and time   History of Anesthesia Complications Negative for: history of anesthetic complications  Airway Mallampati: I  TM Distance: >3 FB Neck ROM: full    Dental no notable dental hx. (+) Chipped   Pulmonary neg shortness of breath, neg sleep apnea, neg COPD, neg recent URI, Current Smoker,    Pulmonary exam normal breath sounds clear to auscultation       Cardiovascular Exercise Tolerance: Good negative cardio ROS Normal cardiovascular exam Rhythm:regular Rate:Normal     Neuro/Psych negative neurological ROS  negative psych ROS   GI/Hepatic Neg liver ROS, GERD  ,  Endo/Other  negative endocrine ROS  Renal/GU negative Renal ROS  negative genitourinary   Musculoskeletal negative musculoskeletal ROS (+)   Abdominal   Peds negative pediatric ROS (+)  Hematology negative hematology ROS (+)   Anesthesia Other Findings Past Medical History:   Abnormal cervical Pap smear with positive HPV *                Comment:HPV   Preterm labor                                                Reproductive/Obstetrics negative OB ROS                             Anesthesia Physical  Anesthesia Plan  ASA: II  Anesthesia Plan: General   Post-op Pain Management:    Induction:   PONV Risk Score and Plan: 3 and Ondansetron, Dexamethasone, Propofol, Midazolam and Treatment may vary due to age or medical condition  Airway Management Planned: Oral ETT  Additional Equipment:   Intra-op Plan:   Post-operative Plan: Extubation in OR  Informed Consent: I have reviewed the patients History and Physical, chart, labs and discussed the procedure including the risks, benefits  and alternatives for the proposed anesthesia with the patient or authorized representative who has indicated his/her understanding and acceptance.   Dental Advisory Given  Plan Discussed with: Anesthesiologist, CRNA and Surgeon  Anesthesia Plan Comments:         Anesthesia Quick Evaluation

## 2016-09-19 NOTE — Progress Notes (Signed)
Chaplain rounding unit visit with pt. Pt had just returned from surgery that she says went well. Pt said she received prayers before going in surgery and was very grateful that things went well. CH offered spiritual support and prayers for pr and her family, which pt appreciated.   09/19/16 1500  Clinical Encounter Type  Visited With Patient  Visit Type Initial;Spiritual support  Referral From Chaplain  Spiritual Encounters  Spiritual Needs Prayer

## 2016-09-19 NOTE — Anesthesia Procedure Notes (Signed)
Procedure Name: Intubation Date/Time: 09/19/2016 11:36 AM Performed by: Dionne Bucy Pre-anesthesia Checklist: Patient identified, Patient being monitored, Timeout performed, Emergency Drugs available and Suction available Patient Re-evaluated:Patient Re-evaluated prior to inductionOxygen Delivery Method: Circle system utilized Preoxygenation: Pre-oxygenation with 100% oxygen Intubation Type: IV induction Ventilation: Mask ventilation without difficulty Laryngoscope Size: Mac and 3 Grade View: Grade I Tube type: Oral Tube size: 7.0 mm Number of attempts: 1 Airway Equipment and Method: Stylet Placement Confirmation: ETT inserted through vocal cords under direct vision,  positive ETCO2 and breath sounds checked- equal and bilateral Secured at: 21 cm Tube secured with: Tape Dental Injury: Teeth and Oropharynx as per pre-operative assessment

## 2016-09-19 NOTE — Progress Notes (Signed)
09/19/16  MD called to patient's room for further discussion.  The patient has decided to proceed with surgical management with appendectomy during this admission rather than conservative management.  Risks of bleeding, infection, injury to surrounding structures, need for further procedures were discussed and patient is willing to proceed.  Will add her on to schedule today.   Howie IllJose Luis Cyniah Gossard, MD [facility]

## 2016-09-19 NOTE — Op Note (Signed)
  Procedure Date:  09/19/2016  Pre-operative Diagnosis:  Acute appendicitis  Post-operative Diagnosis:  Acute appendicitis  Procedure:  Laparoscopic appendectomy  Surgeon:  Howie IllJose Luis Lafreda Casebeer, MD  Anesthesia:  General endotracheal  Estimated Blood Loss:  10 ml  Specimens:  appendix  Complications:  None  Indications for Procedure:  This is a 31 y.o. female who presents with abdominal pain and workup revealing acute appendicitis.  The options of surgery versus observation were reviewed with the patient and/or family. The risks of bleeding, infection, recurrence of symptoms, negative laparoscopy, potential for an open procedure, bowel injury, abscess or infection, were all discussed with the patient and she was willing to proceed.  Description of Procedure: The patient was correctly identified in the preoperative area and brought into the operating room.  The patient was placed supine with VTE prophylaxis in place.  Appropriate time-outs were performed.  Anesthesia was induced and the patient was intubated.  Foley catheter was placed.  Appropriate antibiotics were infused.  The abdomen was prepped and draped in a sterile fashion. An infraumbilical incision was made. A cutdown technique was used to enter the abdominal cavity without injury, and a Hasson trocar was inserted.  Pneumoperitoneum was obtained with appropriate opening pressures.  Two 5-mm ports were placed in the suprapubic and left lateral positions under direct visualization.  The right lower quadrant was inspected and the appendix was identified and found to be acutely inflamed.  The appendix was carefully dissected. The mesoappendix was divided using the LigaSure. The base of the appendix was dissected out and divided with a standard load Endo GIA.  The appendix was placed in an Endocatch bag and brought out through the umbilical incision.  The right lower quadrant was then inspected again revealing an intact staple line, no  bleeding, and no bowel injury.    The 5 mm ports were removed under direct visualization and the Hasson trocar was removed.  The fascial opening was closed using 0 vicryl suture.  Local anesthetic was infused in all incisions and the incisions were closed with 4-0 Monocryl.  The wounds were cleaned and sealed with DermaBond.  Foley catheter was removed and the patient was emerged from anesthesia and extubated and brought to the recovery room for further management.  The patient tolerated the procedure well and all counts were correct at the end of the case.   Howie IllJose Luis Brecklyn Galvis, MD

## 2016-09-19 NOTE — Progress Notes (Signed)
09/19/2016  Subjective: Patient admitted overnight with possible appendicitis.  She reports her pain is better this morning and is able to move better.  Yesterday she reports she was in so much pain that she could not move well.  Vital signs: Temp:  [97.5 F (36.4 C)-98.6 F (37 C)] 98.6 F (37 C) (06/29 0533) Pulse Rate:  [65-78] 72 (06/29 0533) Resp:  [18-20] 20 (06/29 0533) BP: (102-126)/(43-104) 102/43 (06/29 0533) SpO2:  [99 %-100 %] 99 % (06/29 0533) Weight:  [86.2 kg (190 lb)-99 kg (218 lb 3.2 oz)] 99 kg (218 lb 3.2 oz) (06/29 0100)   Intake/Output: 06/28 0701 - 06/29 0700 In: 1249 [IV Piggyback:1249] Out: -  Last BM Date:  (unknown)  Physical Exam: Constitutional: No acute distress Abdomen:  Soft, nondistended, with tenderness to palpation in the low abdomen and right lower quadrant.  No diffuse peritonitis.  Labs:   Recent Labs  09/18/16 1722 09/19/16 0403  WBC 20.8* 19.2*  HGB 13.7 12.1  HCT 40.9 35.5  PLT 338 278    Recent Labs  09/18/16 1722 09/19/16 0403  NA 139 140  K 3.0* 3.3*  CL 107 109  CO2 22 26  GLUCOSE 121* 116*  BUN 12 11  CREATININE 0.85 0.84  CALCIUM 9.5 8.4*   No results for input(s): LABPROT, INR in the last 72 hours.  Imaging: Dg Chest 2 View  Result Date: 09/18/2016 CLINICAL DATA:  Right upper quadrant abdominal pain. Diaphoresis. Cough for the past 3 weeks. Previous left rib fractures. EXAM: CHEST  2 VIEW COMPARISON:  03/25/2012. FINDINGS: Normal sized heart. Clear lungs with normal vascularity. Old, healed left rib fractures. IMPRESSION: No acute abnormality. Electronically Signed   By: Beckie Salts M.D.   On: 09/18/2016 20:01   Ct Renal Stone Study  Result Date: 09/18/2016 CLINICAL DATA:  Right upper quadrant and epigastric abdominal pain and back pain. EXAM: CT ABDOMEN AND PELVIS WITHOUT CONTRAST TECHNIQUE: Multidetector CT imaging of the abdomen and pelvis was performed following the standard protocol without IV contrast.  COMPARISON:  Right upper quadrant abdomen ultrasound obtained earlier today. Abdomen and pelvis CT dated 09/26/2009. FINDINGS: Lower chest: Unremarkable. Hepatobiliary: Probable sludge in the dependent portion of the gallbladder. No gallbladder wall thickening or pericholecystic fluid. Normal appearing liver. Pancreas: Unremarkable. No pancreatic ductal dilatation or surrounding inflammatory changes. Spleen: Normal in size without focal abnormality. Adrenals/Urinary Tract: Adrenal glands are unremarkable. Kidneys are normal, without renal calculi, focal lesion, or hydronephrosis. Bladder is unremarkable. Stomach/Bowel: Dilated, fluid-filled appendix with diffuse wall thickening. The appendix measures 11.9 mm in maximum diameter on image number 75 of series 2. Mild periappendiceal soft tissue stranding. Probable poorly defined proximal appendicolith or appendicoliths, faintly calcified. The appendix is located in the right mid to lower pelvis. Normal appearing stomach, small bowel and colon. Vascular/Lymphatic: Mildly prominent right lower quadrant mesenteric lymph nodes. The largest has a short axis diameter of 7 mm on image number 55 of series 2. No arterial calcifications or aneurysm. Reproductive: Uterus and bilateral adnexa are unremarkable. Other: Small amount of free peritoneal fluid in the pelvic cul-de-sac. Small umbilical and paraumbilical hernia containing fat. Musculoskeletal: Normal appearing bones. IMPRESSION: 1. Acute appendicitis without abscess. 2. Minimal reactive right lower quadrant mesenteric adenopathy. 3. Small umbilical and paraumbilical hernia containing fat. These results were called by telephone at the time of interpretation on 09/18/2016 at 9:16 pm to Dr. Ileana Roup , who verbally acknowledged these results. Electronically Signed   By: Zada Finders.D.  On: 09/18/2016 21:17   [place] Abdomen Limited Ruq  Result Date: 09/18/2016 CLINICAL DATA:  Right upper quadrant pain. EXAM: ULTRASOUND  ABDOMEN LIMITED RIGHT UPPER QUADRANT COMPARISON:  None. FINDINGS: Gallbladder: No gallstones or wall thickening visualized. No sonographic Murphy sign noted by sonographer. Common bile duct: Diameter: 4.6 mm Liver: No focal lesion identified. Within normal limits in parenchymal echogenicity. IMPRESSION: No acute abnormalities identified. Electronically Signed   By: Gerome Samavid  Williams III M.D   On: 09/18/2016 19:26    Assessment/Plan: 31 yo female with possible acute appendicitis.  --Had lengthy discussion with patient regarding management options for her appendicitis, including conservative with antibiotics alone vs surgical with appendectomy during this admission.  Patient would rather go home quicker, but she has 6 children to take care of and is worried also about the weight lifting/pushing restrictions for 4-6 weeks if surgery is done.  The other option would be no surgery but she would stay longer for antibiotic management. --Will start with conservative management to give her at least 24 hrs of IV antibiotics and assess daily.  If improving, will continue conservative pathway.  The patient is interested in interval appendectomy in the future if conservative management works.  That way she can plan better for the surgery and daycare/babysitter/help. --Continue IV antibiotics, NPO with IV fluid hydration, and appropriate nausea/pain control.   Howie IllJose Luis Shaymus Eveleth, MD [facility]

## 2016-09-20 MED ORDER — OXYCODONE HCL 5 MG PO TABS: 5.0000 mg | ORAL_TABLET | ORAL | 0 refills | Status: DC | PRN

## 2016-09-20 NOTE — Progress Notes (Signed)
Patient discharged to home. Discharge instructions given and patient instructed to call Dr. Aleen CampiPiscoya office and schedule an appointment in 2-3 weeks. Prescription given to patient for pain.  Patient is alert and oriented denies pain at this time ambulated well no acute distress noted.

## 2016-09-20 NOTE — Discharge Summary (Addendum)
Patient ID: Casimer Leekshley A Schnabel MRN: [id number] DOB/AGE: 31/06/1985 30 y.o.  Admit date: 09/18/2016 Discharge date: 09/20/2016   Discharge Diagnoses:  Active Problems:   Abdominal pain   Acute appendicitis   Procedures:  Laparoscopic appendectomy  Hospital Course:  Patient was admitted on 6/28 with possible appendicitis.  Her pain did not improve significantly by 6/29 and she elected to go with surgical management.  She had a laparoscopic appendectomy with no complications.  Her diet was advanced, she was tolerating well.  Pain was well controlled.  Was ambulating and voiding without issues.  She was deemed ready for discharge to home.  On exam, she was in no acute distress with stable vital signs.  Her abdomen was soft, nondistended, and appropriately tender to palpation.  Incisions were clean, dry, and intact.   Consults: None  Disposition: 01-Home or Self Care  Discharge Instructions    Call MD for:  difficulty breathing, headache or visual disturbances    Complete by:  As directed    Call MD for:  persistant nausea and vomiting    Complete by:  As directed    Call MD for:  redness, tenderness, or signs of infection (pain, swelling, redness, odor or green/yellow discharge around incision site)    Complete by:  As directed    Call MD for:  severe uncontrolled pain    Complete by:  As directed    Call MD for:  temperature >100.4    Complete by:  As directed    Diet - low sodium heart healthy    Complete by:  As directed    Discharge instructions    Complete by:  As directed    1.  Patient may shower, but do not scrub wounds heavily and dab dry only. 2.  Do not submerge the wounds in pool/tub for 1 week. 3.  Do not apply ointments or hydrogen peroxide to the wounds.   Driving Restrictions    Complete by:  As directed    Do not drive while taking narcotics for pain control.   Increase activity slowly    Complete by:  As directed    Lifting restrictions    Complete by:  As  directed    No heavy lifting or pushing of more than 10-15 lbs for 4 weeks.   No dressing needed    Complete by:  As directed      Allergies as of 09/20/2016      Reactions   Sulfa Antibiotics Rash      Medication List    TAKE these medications   calcium carbonate 750 MG chewable tablet Commonly known as:  TUMS EX Chew 1 tablet by mouth daily as needed for heartburn.   oxyCODONE 5 MG immediate release tablet Commonly known as:  Oxy IR/ROXICODONE Take 1-2 tablets (5-10 mg total) by mouth every 4 (four) hours as needed for moderate pain or severe pain.      Follow-up Information    Letisha Yera, Elita QuickJose, MD Follow up in 3 week(s).   Specialty:  Surgery Why:  Follow up in 2-3 weeks. Contact information: [address] [place] [address] [phone]

## 2016-09-22 ENCOUNTER — Telehealth: Payer: Self-pay

## 2016-09-22 LAB — SURGICAL PATHOLOGY

## 2016-09-22 NOTE — Telephone Encounter (Signed)
Post-op call made to patient at this time. Spoke with Annette Cline. Post-op interview questions below.  1. How are you feeling? Some soreness and pain  2. Is your pain controlled? Yes  3. What are you doing for the pain? Taking 2 oxycodone daily 4. Are you having any Nausea or Vomiting? no  5. Are you having any Fever or Chills? no  6. Are you having any Constipation or Diarrhea? no  7. Is there any Swelling or Bruising you are concerned about? No  8. Do you have any questions or concerns at this time?    Discussion: Scheduled the patient a follow up appointment with Dr. Aleen CampiPiscoya on 7/19 at [facility]9AM

## 2016-10-06 ENCOUNTER — Encounter: Payer: Self-pay | Admitting: Emergency Medicine

## 2016-10-06 ENCOUNTER — Emergency Department
Admission: EM | Admit: 2016-10-06 | Discharge: 2016-10-06 | Disposition: A | Discharge status: Home / Self Care | Payer: Medicaid Other | Attending: Emergency Medicine | Admitting: Emergency Medicine

## 2016-10-06 DIAGNOSIS — F17210 Nicotine dependence, cigarettes, uncomplicated: Secondary | ICD-10-CM | POA: Diagnosis not present

## 2016-10-06 DIAGNOSIS — L03311 Cellulitis of abdominal wall: Secondary | ICD-10-CM | POA: Insufficient documentation

## 2016-10-06 DIAGNOSIS — R198 Other specified symptoms and signs involving the digestive system and abdomen: Secondary | ICD-10-CM | POA: Diagnosis present

## 2016-10-06 MED ORDER — CEPHALEXIN 500 MG PO CAPS
500.0000 mg | ORAL_CAPSULE | Freq: Once | ORAL | Status: AC
  Administered 2016-10-06: 500 mg via ORAL
  Filled 2016-10-06: qty 1

## 2016-10-06 MED ORDER — CEPHALEXIN 500 MG PO CAPS: 500.0000 mg | ORAL_CAPSULE | Freq: Four times a day (QID) | ORAL | 0 refills | Status: AC

## 2016-10-06 NOTE — Discharge Instructions (Signed)
Please take all of your antibiotics as prescribed and keep your follow-up appointment with your surgeon this coming Thursday as scheduled. Please return to the emergency department for any concerns such as fevers, chills, shortness of breath, if you cannot eat or drink, or for any other concerns whatsoever.  It was a pleasure to take care of you today, and thank you for coming to our emergency department.  If you have any questions or concerns before leaving please ask the nurse to grab me and I'm more than happy to go through your aftercare instructions again.  If you were prescribed any opioid pain medication today such as Norco, Vicodin, Percocet, morphine, hydrocodone, or oxycodone please make sure you do not drive when you are taking this medication as it can alter your ability to drive safely.  If you have any concerns once you are home that you are not improving or are in fact getting worse before you can make it to your follow-up appointment, please do not hesitate to call 911 and come back for further evaluation.  Merrily BrittleNeil Deandrew Hoecker MD  Results for orders placed or performed during the hospital encounter of 09/18/16  MRSA PCR Screening  Result Value Ref Range   MRSA by PCR NEGATIVE NEGATIVE  Lipase, blood  Result Value Ref Range   Lipase 22 11 - 51 U/L  Comprehensive metabolic panel  Result Value Ref Range   Sodium 139 135 - 145 mmol/L   Potassium 3.0 (L) 3.5 - 5.1 mmol/L   Chloride 107 101 - 111 mmol/L   CO2 22 22 - 32 mmol/L   Glucose, Bld 121 (H) 65 - 99 mg/dL   BUN 12 6 - 20 mg/dL   Creatinine, Ser 4.540.85 0.44 - 1.00 mg/dL   Calcium 9.5 8.9 - 09.810.3 mg/dL   Total Protein 8.0 6.5 - 8.1 g/dL   Albumin 4.5 3.5 - 5.0 g/dL   AST 21 15 - 41 U/L   ALT 16 14 - 54 U/L   Alkaline Phosphatase 84 38 - 126 U/L   Total Bilirubin 0.5 0.3 - 1.2 mg/dL   GFR calc non Af Amer >60 >60 mL/min   GFR calc Af Amer >60 >60 mL/min   Anion gap 10 5 - 15  CBC  Result Value Ref Range   WBC 20.8 (H) 3.6  - 11.0 K/uL   RBC 4.99 3.80 - 5.20 MIL/uL   Hemoglobin 13.7 12.0 - 16.0 g/dL   HCT 11.940.9 14.735.0 - 82.947.0 %   MCV 82.0 80.0 - 100.0 fL   MCH 27.4 26.0 - 34.0 pg   MCHC 33.4 32.0 - 36.0 g/dL   RDW 56.213.5 13.011.5 - 86.514.5 %   Platelets 338 150 - 440 K/uL  Glucose, capillary  Result Value Ref Range   Glucose-Capillary 126 (H) 65 - 99 mg/dL  hCG, quantitative, pregnancy  Result Value Ref Range   hCG, Beta Chain, Quant, S <1 <5 mIU/mL  Comprehensive metabolic panel  Result Value Ref Range   Sodium 140 135 - 145 mmol/L   Potassium 3.3 (L) 3.5 - 5.1 mmol/L   Chloride 109 101 - 111 mmol/L   CO2 26 22 - 32 mmol/L   Glucose, Bld 116 (H) 65 - 99 mg/dL   BUN 11 6 - 20 mg/dL   Creatinine, Ser 7.840.84 0.44 - 1.00 mg/dL   Calcium 8.4 (L) 8.9 - 10.3 mg/dL   Total Protein 6.7 6.5 - 8.1 g/dL   Albumin 3.7 3.5 - 5.0 g/dL  AST 17 15 - 41 U/L   ALT 13 (L) 14 - 54 U/L   Alkaline Phosphatase 69 38 - 126 U/L   Total Bilirubin 0.6 0.3 - 1.2 mg/dL   GFR calc non Af Amer >60 >60 mL/min   GFR calc Af Amer >60 >60 mL/min   Anion gap 5 5 - 15  Magnesium  Result Value Ref Range   Magnesium 1.8 1.7 - 2.4 mg/dL  Phosphorus  Result Value Ref Range   Phosphorus 4.0 2.5 - 4.6 mg/dL  CBC  Result Value Ref Range   WBC 19.2 (H) 3.6 - 11.0 K/uL   RBC 4.35 3.80 - 5.20 MIL/uL   Hemoglobin 12.1 12.0 - 16.0 g/dL   HCT 04.5 40.9 - 81.1 %   MCV 81.7 80.0 - 100.0 fL   MCH 27.8 26.0 - 34.0 pg   MCHC 34.0 32.0 - 36.0 g/dL   RDW 91.4 78.2 - 95.6 %   Platelets 278 150 - 440 K/uL  Differential  Result Value Ref Range   Neutrophils Relative % 73 %   Neutro Abs 13.8 (H) 1.4 - 6.5 K/uL   Lymphocytes Relative 19 %   Lymphs Abs 3.6 1.0 - 3.6 K/uL   Monocytes Relative 6 %   Monocytes Absolute 1.1 (H) 0.2 - 0.9 K/uL   Eosinophils Relative 1 %   Eosinophils Absolute 0.1 0 - 0.7 K/uL   Basophils Relative 1 %   Basophils Absolute 0.1 0 - 0.1 K/uL  Surgical pathology  Result Value Ref Range   SURGICAL PATHOLOGY      Surgical  Pathology CASE: [id number] PATIENT: Annette Cline Surgical Pathology Report     SPECIMEN SUBMITTED: A. Appendix  CLINICAL HISTORY: None provided  PRE-OPERATIVE DIAGNOSIS: N/A  POST-OPERATIVE DIAGNOSIS: Appendicitis     DIAGNOSIS: A. APPENDIX; LAPAROSCOPIC APPENDECTOMY: - ACUTE APPENDICITIS AND SEROSITIS. - NEGATIVE FOR DYSPLASIA AND MALIGNANCY.   GROSS DESCRIPTION:  A. Labeled: appendix  Size: 7.3 x 1.2 x 1.4 cm  External surface: shaggy purple tan  Perforation: none grossly identified  Fecalith: no  Description:  The proximal margin is inked blue. The specimen is filled with red material. The wall thickness is up to 0.5 and mucosa and wall are red to tan.  Block summary: 1 - representative sections Final Diagnosis performed by Glenice Bow, MD.  Electronically signed 09/22/2016 10:17:31AM    The electronic signature indicates that the named Attending Pathologist has evaluated the specimen  Technical component performed at  [facility], [address] [place], [place] [address] Lab: [phone] Dir: Titus Dubin. Cato Mulligan, MD  Professional component performed at [facility], [facility], [address] [place], [place], [place] [address] Lab: [phone] Dir: Georgiann Cocker. Oneita Kras, MD     Dg Chest 2 View  Result Date: 09/18/2016 CLINICAL DATA:  Right upper quadrant abdominal pain. Diaphoresis. Cough for the past 3 weeks. Previous left rib fractures. EXAM: CHEST  2 VIEW COMPARISON:  03/25/2012. FINDINGS: Normal sized heart. Clear lungs with normal vascularity. Old, healed left rib fractures. IMPRESSION: No acute abnormality. Electronically Signed   By: Beckie Salts M.D.   On: 09/18/2016 20:01   Ct Renal Stone Study  Result Date: 09/18/2016 CLINICAL DATA:  Right upper quadrant and epigastric abdominal pain and back pain. EXAM: CT ABDOMEN AND PELVIS WITHOUT CONTRAST TECHNIQUE: Multidetector CT imaging of the abdomen and pelvis was performed following the  standard protocol without IV contrast. COMPARISON:  Right upper quadrant abdomen ultrasound obtained earlier today. Abdomen and pelvis CT dated 09/26/2009.  FINDINGS: Lower chest: Unremarkable. Hepatobiliary: Probable sludge in the dependent portion of the gallbladder. No gallbladder wall thickening or pericholecystic fluid. Normal appearing liver. Pancreas: Unremarkable. No pancreatic ductal dilatation or surrounding inflammatory changes. Spleen: Normal in size without focal abnormality. Adrenals/Urinary Tract: Adrenal glands are unremarkable. Kidneys are normal, without renal calculi, focal lesion, or hydronephrosis. Bladder is unremarkable. Stomach/Bowel: Dilated, fluid-filled appendix with diffuse wall thickening. The appendix measures 11.9 mm in maximum diameter on image number 75 of series 2. Mild periappendiceal soft tissue stranding. Probable poorly defined proximal appendicolith or appendicoliths, faintly calcified. The appendix is located in the right mid to lower pelvis. Normal appearing stomach, small bowel and colon. Vascular/Lymphatic: Mildly prominent right lower quadrant mesenteric lymph nodes. The largest has a short axis diameter of 7 mm on image number 55 of series 2. No arterial calcifications or aneurysm. Reproductive: Uterus and bilateral adnexa are unremarkable. Other: Small amount of free peritoneal fluid in the pelvic cul-de-sac. Small umbilical and paraumbilical hernia containing fat. Musculoskeletal: Normal appearing bones. IMPRESSION: 1. Acute appendicitis without abscess. 2. Minimal reactive right lower quadrant mesenteric adenopathy. 3. Small umbilical and paraumbilical hernia containing fat. These results were called by telephone at the time of interpretation on 09/18/2016 at 9:16 pm to Dr. Ileana Roup , who verbally acknowledged these results. Electronically Signed   By: Beckie Salts M.D.   On: 09/18/2016 21:17   [place] Abdomen Limited Ruq  Result Date: 09/18/2016 CLINICAL DATA:   Right upper quadrant pain. EXAM: ULTRASOUND ABDOMEN LIMITED RIGHT UPPER QUADRANT COMPARISON:  None. FINDINGS: Gallbladder: No gallstones or wall thickening visualized. No sonographic Murphy sign noted by sonographer. Common bile duct: Diameter: 4.6 mm Liver: No focal lesion identified. Within normal limits in parenchymal echogenicity. IMPRESSION: No acute abnormalities identified. Electronically Signed   By: Gerome Sam III M.D   On: 09/18/2016 19:26

## 2016-10-06 NOTE — ED Provider Notes (Signed)
[facility] Emergency Department Provider Note  ____________________________________________   First MD Initiated Contact with Patient 10/06/16 1156     (approximate)  I have reviewed the triage vital signs and the nursing notes.   HISTORY  Chief Complaint Post op complications    HPI Annette Cline is a 31 y.o. female who comes to the emergency department with 2 days of drainage from her umbilicus. On June 28 of this year roughly 2-1/2 weeks ago she had a laparoscopic appendectomy performed at our facility and did well postoperatively. 2 days ago her daughter kicked her abdomen accidentally and the patient noted afterwards the Dermabond dislodged and she's been draining foul-smelling discharge ever since. She's had no fevers or chills. No abdominal pain. She has been able to eat and drink without difficulty. Nothing seems to make the drainage more or less. Today she called the clinic hoping to get a walk-in appointment has her appointment is in 3 days however they advised her to come to the emergency department instead.   Past Medical History:  Diagnosis Date  . 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 Preterm labor     Patient Active Problem List   Diagnosis Date Noted  . Acute appendicitis   . Abdominal pain 09/18/2016  . Indication for care in labor or delivery 02/07/2016  . Insufficient prenatal care 02/06/2016  . Supervision of high-risk pregnancy, third trimester 02/06/2016  . Rupture of membranes with clear amniotic fluid 02/06/2016  . Labor and delivery indication for care or intervention 02/07/2015    Past Surgical History:  Procedure Laterality Date  . CERVICAL CONIZATION W/BX N/A 04/17/2015   Procedure: CONIZATION CERVIX WITH BIOPSY;  Surgeon: Elenora Fenderhelsea C Ward, MD;  Location: [facility] ORS;  Service: Gynecology;  Laterality: N/A;  . LAPAROSCOPIC APPENDECTOMY N/A 09/19/2016   Procedure: APPENDECTOMY LAPAROSCOPIC;   Surgeon: Henrene DodgePiscoya, Jose, MD;  Location: [facility] ORS;  Service: General;  Laterality: N/A;  . TONSILLECTOMY      Prior to Admission medications   Medication Sig Start Date End Date Taking? Authorizing Provider  calcium carbonate (TUMS EX) 750 MG chewable tablet Chew 1 tablet by mouth daily as needed for heartburn.    [provider]  cephALEXin (KEFLEX) 500 MG capsule Take 1 capsule (500 mg total) by mouth 4 (four) times daily. 10/06/16 10/13/16  Merrily Brittleifenbark, Shimshon Narula, MD  oxyCODONE (OXY IR/ROXICODONE) 5 MG immediate release tablet Take 1-2 tablets (5-10 mg total) by mouth every 4 (four) hours as needed for moderate pain or severe pain. Patient not taking: Reported on 10/06/2016 09/20/16   Henrene DodgePiscoya, Jose, MD    Allergies Sulfa antibiotics  Family History  Problem Relation Age of Onset  . Cancer Mother   . Diabetes Mother   . Cancer Maternal Grandmother   . Hyperlipidemia Maternal Grandmother     Social History Social History  Substance Use Topics  . Smoking status: Current Every Day Smoker    Packs/day: 1.00    Types: Cigarettes  . Smokeless tobacco: Never Used     Comment: smoked 1 PPD prior to pregnancy and 2 cigs/day with pregnancy  . Alcohol use No    Review of Systems Constitutional: No fever/chills Eyes: No visual changes. ENT: No sore throat. Cardiovascular: Denies chest pain. Respiratory: Denies shortness of breath. Gastrointestinal: No abdominal pain.  No nausea, no vomiting.  No diarrhea.  No constipation. Genitourinary: Negative for dysuria. Musculoskeletal: Negative for back pain. Skin: Positive for  rash. Neurological: Negative for headaches, focal weakness or numbness.   ____________________________________________   PHYSICAL EXAM:  VITAL SIGNS: ED Triage Vitals  Enc Vitals Group     BP 10/06/16 1026 (!) 128/93     Pulse Rate 10/06/16 1026 82     Resp 10/06/16 1026 20     Temp 10/06/16 1026 98.3 F (36.8 C)     Temp Source 10/06/16 1026 Oral     SpO2  10/06/16 1026 97 %     Weight 10/06/16 1027 200 lb (90.7 kg)     Height 10/06/16 1027 5\' 5"  (1.651 m)     Head Circumference --      Peak Flow --      Pain Score 10/06/16 1028 0     Pain Loc --      Pain Edu? --      Excl. in GC? --     Constitutional: Alert and oriented 4 very well-appearing nontoxic no diaphoresis speaks in full clear sentences Eyes: PERRL EOMI. Head: Atraumatic. Nose: No congestion/rhinnorhea. Mouth/Throat: No trismus Neck: No stridor.   Cardiovascular: Normal rate, regular rhythm. Grossly normal heart sounds.  Good peripheral circulation. Respiratory: Normal respiratory effort.  No retractions. Lungs CTAB and moving good air Gastrointestinal: Soft abdomen nontender no rebound or guarding no peritonitis I probed her umbilicus and it does not enter the abdominal cavity. On the distal aspect of the umbilicus the wound has opened slightly but again does not probed. There is no erythema warmth or tenderness. She does have thin serous drainage the umbilicus is surrounded by honey golden crusting discharge Musculoskeletal: No lower extremity edema   Neurologic:  Normal speech and language. No gross focal neurologic deficits are appreciated. Skin:  Skin is warm, dry and intact. No rash noted. Psychiatric: Mood and affect are normal. Speech and behavior are normal.    ____________________________________________   DIFFERENTIAL includes but not limited to  Cellulitis, abdominal abscess, fistula   LABS (all labs ordered are listed, but only abnormal results are displayed)  Labs Reviewed - No data to display   __________________________________________  EKG   ____________________________________________  RADIOLOGY   ____________________________________________   PROCEDURES  Procedure(s) performed: no  Procedures  Critical Care performed: no  Observation: no ____________________________________________   INITIAL IMPRESSION / ASSESSMENT AND  PLAN / ED COURSE  Pertinent labs & imaging results that were available during my care of the patient were reviewed by me and considered in my medical decision making (see chart for details).  The patient arrives very well-appearing laughing joking with a completely benign abdominal exam. Her umbilicus is clearly closed although does have some thin yellow discharge. I will touch base with the on-call surgeon for recommendations.     ----------------------------------------- 12:07 PM on 10/06/2016 -----------------------------------------  I called the on-call general surgeon Dr. Tonita Cong for recommendations however he is currently operating. I notified the patient to let her know it would be roughly an hour before it can get recommendations, and she said that she would prefer to go home and keep her appointment in 3 days. Her surgical wound is clearly closed and there is no purulent material. She has a benign abdominal exam and is eating and drinking. She does have some thin serous drainage and mild honey golden crusting which is possible with a streptococcal infection. I will cover her with Keflex for now instructed return precautions. She is discharged home in good condition. ____________________________________________   FINAL CLINICAL IMPRESSION(S) / ED DIAGNOSES  Final diagnoses:  Cellulitis of abdominal wall      NEW MEDICATIONS STARTED DURING THIS VISIT:  New Prescriptions   CEPHALEXIN (KEFLEX) 500 MG CAPSULE    Take 1 capsule (500 mg total) by mouth 4 (four) times daily.     Note:  This document was prepared using Dragon voice recognition software and may include unintentional dictation errors.     Merrily Brittle, MD 10/06/16 [phone]

## 2016-10-06 NOTE — ED Notes (Signed)
Pt had appendix removed 6/29. Noticed few days ago that incision in belly button has had drainage. Other incisions are closed and well healed. Pt has small amount of drainage to belly button. Denies fevers. Tried to call surgeon but couldn't be seen today.

## 2016-10-06 NOTE — ED Triage Notes (Signed)
Pt reports having laparoscopic appendectomy three weeks ago. Pt reports all surgical incisions healed without problems except umbilical. Pt reports in the past several days the incision has opened up and has been draining yellow liquid. Pt reports stinging at the sight. Pt reports some nausea but denies vomiting. Denies fever.

## 2016-10-09 ENCOUNTER — Ambulatory Visit (INDEPENDENT_AMBULATORY_CARE_PROVIDER_SITE_OTHER): Payer: Medicaid Other | Admitting: Surgery

## 2016-10-09 ENCOUNTER — Encounter: Payer: Self-pay | Admitting: Surgery

## 2016-10-09 VITALS — BP 131/81 | HR 77 | Temp 98.7°F | Ht 65.0 in | Wt 222.2 lb

## 2016-10-09 DIAGNOSIS — Z09 Encounter for follow-up examination after completed treatment for conditions other than malignant neoplasm: Secondary | ICD-10-CM

## 2016-10-09 NOTE — Progress Notes (Signed)
10/09/2016  HPI: Patient is s/p laparoscopic appendectomy on 6/29.  Presented to the ED on 7/16 with drainage from her umbilical incision and was started on Keflex.  Patient reports that her daughter kicked her at the incision and the wound opened and drained yellow serous fluid.  She was concerned and went to the ED.  Since then she reports that the drainage has decreased and has not had other issues.  She takes her Keflex without side effects.  Tolerating diet, normal bowel movements, no fevers or chills.  Vital signs: BP 131/81   Pulse 77   Temp 98.7 F (37.1 C) (Oral)   Ht 5\' 5"  (1.651 m)   Wt 100.8 kg (222 lb 3.2 oz)   Breastfeeding? Yes   BMI 36.98 kg/m    Physical Exam: Constitutional: No acute distress Abdomen:  Soft, nondistended, nontender to palpation.  The umbilical incision opened on the left lateral side, with subcutaneous tissue exposed.  No evidence of infection and no active drainage at the moment.  Other incisions clean, dry, and intact.  Assessment/Plan: 31 -year-old female status post laparoscopic appendectomy.  -Reviewed pathology with the patient. She had acute appendicitis and serositis with no evidence of dysplasia or malignancy. -Reassured the patient and her wound is not infected and that its only a superficial opening of the skin. Recommended that she can dress the wound with dry gauze to keep the wound dry and prevent any of her clothes from staining with drainage. She does not need to apply any ointments or hydrogen peroxide but if she wants to she can. She can shower but no prescription the wound heavily. -She says a new IV lifting or pushing restriction of no more than 10-15 pounds until 7/27. She may resume her usual activities after that. -Patient may follow-up with us on an as-needed basis. Return precautions have been given to the patient.   Howie IllJose Luis Yuri Flener, MD [facility]

## 2016-10-09 NOTE — Patient Instructions (Signed)
Continue your antibiotics until they are complete. Continue the Ibuprofen as needed. Make sure that you eat prior to taking this to avoid any GI upset.  Please do not submerge in a tub, hot tub, or pool until incisions are completely sealed.  Use sun block to incision area over the next year if this area will be exposed to sun. This helps decrease scarring.  You may resume your normal activities on 10/17/16. At that time- Listen to your body when lifting, if you have pain when lifting, stop and then try again in a few days. Soreness after doing exercises or activities of daily living is normal as you get back in to your normal routine.  If you develop redness, drainage, or pain at incision sites- call our office immediately and speak with a nurse.  Please call our office with any questions or concerns.

## 2017-06-14 ENCOUNTER — Encounter: Payer: Self-pay | Admitting: Medical Oncology

## 2017-06-14 ENCOUNTER — Emergency Department
Admission: EM | Admit: 2017-06-14 | Discharge: 2017-06-14 | Disposition: A | Discharge status: Home / Self Care | Payer: Medicaid Other | Attending: Student in an Organized Health Care Education/Training Program | Admitting: Student in an Organized Health Care Education/Training Program

## 2017-06-14 DIAGNOSIS — F17210 Nicotine dependence, cigarettes, uncomplicated: Secondary | ICD-10-CM | POA: Insufficient documentation

## 2017-06-14 DIAGNOSIS — J40 Bronchitis, not specified as acute or chronic: Secondary | ICD-10-CM | POA: Insufficient documentation

## 2017-06-14 DIAGNOSIS — J029 Acute pharyngitis, unspecified: Secondary | ICD-10-CM | POA: Insufficient documentation

## 2017-06-14 DIAGNOSIS — G501 Atypical facial pain: Secondary | ICD-10-CM | POA: Diagnosis present

## 2017-06-14 MED ORDER — DEXAMETHASONE SODIUM PHOSPHATE 10 MG/ML IJ SOLN
10.0000 mg | Freq: Once | INTRAMUSCULAR | Status: AC
  Administered 2017-06-14: 10 mg via INTRAMUSCULAR
  Filled 2017-06-14: qty 1

## 2017-06-14 MED ORDER — FLUTICASONE PROPIONATE 50 MCG/ACT NA SUSP: 2.0000 | Freq: Every day | NASAL | 0 refills | Status: DC

## 2017-06-14 MED ORDER — AZITHROMYCIN 250 MG PO TABS: ORAL_TABLET | ORAL | 0 refills | Status: DC

## 2017-06-14 NOTE — Discharge Instructions (Addendum)
Your exam is consistent with a likely viral URI and bronchitis. You will be placed on Azithromycin for any possible pharyngitis and bronchitis. Take the antibiotic as directed. Continue with your home meds.

## 2017-06-14 NOTE — ED Triage Notes (Signed)
Pt reports sinus pressure, sore throat, fever and chills that began Friday.

## 2017-06-14 NOTE — ED Provider Notes (Signed)
[facility] Emergency Department Provider Note ____________________________________________  Time seen: 1315  I have reviewed the triage vital signs and the nursing notes.  HISTORY  Chief Complaint  Facial Pain; Sore Throat; and Fever  HPI Annette Cline is a 32 y.o. female presents to the ED with complaint of sinus pressure, sore throat, and subjective fever.  Patient describes symptoms began on Friday.  He is also had some postnasal drainage and some hoarseness to her voice.  She denies any nausea, vomiting, rash, or body aches.  She is status post tonsillectomy.  Past Medical History:  Diagnosis Date  . 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 Preterm labor     Patient Active Problem List   Diagnosis Date Noted  . Acute appendicitis   . Abdominal pain 09/18/2016  . Indication for care in labor or delivery 02/07/2016  . Insufficient prenatal care 02/06/2016  . Supervision of high-risk pregnancy, third trimester 02/06/2016  . Rupture of membranes with clear amniotic fluid 02/06/2016  . Labor and delivery indication for care or intervention 02/07/2015    Past Surgical History:  Procedure Laterality Date  . CERVICAL CONIZATION W/BX N/A 04/17/2015   Procedure: CONIZATION CERVIX WITH BIOPSY;  Surgeon: Elenora Fender Ward, MD;  Location: [facility] ORS;  Service: Gynecology;  Laterality: N/A;  . LAPAROSCOPIC APPENDECTOMY N/A 09/19/2016   Procedure: APPENDECTOMY LAPAROSCOPIC;  Surgeon: Henrene Dodge, MD;  Location: [facility] ORS;  Service: General;  Laterality: N/A;  . TONSILLECTOMY      Prior to Admission medications   Medication Sig Start Date End Date Taking? Authorizing Provider  azithromycin (ZITHROMAX Z-PAK) 250 MG tablet Take 2 tablets (500 mg) on  Day 1,  followed by 1 tablet (250 mg) once daily on Days 2 through 5. 06/14/17   Johany Hansman, Charlesetta Ivory, PA-C  calcium carbonate (TUMS EX) 750 MG chewable tablet Chew 1 tablet by mouth daily as  needed for heartburn.    [provider]  fluticasone (FLONASE) 50 MCG/ACT nasal spray Place 2 sprays into both nostrils daily. 06/14/17   Mindy Behnken, Charlesetta Ivory, PA-C  ibuprofen (ADVIL,MOTRIN) 200 MG tablet Take 800 mg by mouth every 8 (eight) hours as needed.    [provider]    Allergies Sulfa antibiotics  Family History  Problem Relation Age of Onset  . Cancer Mother   . Diabetes Mother   . Cancer Maternal Grandmother   . Hyperlipidemia Maternal Grandmother     Social History Social History   Tobacco Use  . Smoking status: Current Every Day Smoker    Packs/day: 1.00    Types: Cigarettes  . Smokeless tobacco: Never Used  . Tobacco comment: smoked 1 PPD prior to pregnancy and 2 cigs/day with pregnancy  Substance Use Topics  . Alcohol use: No  . Drug use: No    Review of Systems  Constitutional: Positive for fever. Eyes: Negative for visual changes. ENT: Positive for sore throat. Cardiovascular: Negative for chest pain. Respiratory: Negative for shortness of breath. Gastrointestinal: Negative for abdominal pain, vomiting and diarrhea. Genitourinary: Negative for dysuria. Musculoskeletal: Negative for back pain. Skin: Negative for rash. Neurological: Negative for headaches, focal weakness or numbness. ____________________________________________  PHYSICAL EXAM:  VITAL SIGNS: ED Triage Vitals  Enc Vitals Group     BP 06/14/17 1152 118/71     Pulse Rate 06/14/17 1152 96     Resp 06/14/17 1152 18     Temp 06/14/17 1152 98.3  F (36.8 C)     Temp Source 06/14/17 1152 Oral     SpO2 06/14/17 1152 98 %     Weight 06/14/17 1149 222 lb (100.7 kg)     Height --      Head Circumference --      Peak Flow --      Pain Score 06/14/17 1148 7     Pain Loc --      Pain Edu? --      Excl. in GC? --     Constitutional: Alert and oriented. Well appearing and in no distress. Head: Normocephalic and atraumatic. Eyes: Conjunctivae are normal. PERRL.  Normal extraocular movements Ears: Canals clear. TMs intact bilaterally. Nose: No congestion/rhinorrhea/epistaxis.  Nasal turbinates are enlarged.  Nasal polyps appreciated. Mouth/Throat: Mucous membranes are moist.  Uvula is midline and tonsils are absent.  There is some indication of postnasal drainage noted. Neck: Supple. No thyromegaly. Hematological/Lymphatic/Immunological: Palpable anterior cervical lymphadenopathy. Cardiovascular: Normal rate, regular rhythm. Normal distal pulses. Respiratory: Normal respiratory effort. No wheezes/rales/rhonchi. Gastrointestinal: Soft and nontender. No distention. ____________________________________________  PROCEDURES  Procedures Decadron 10 mg IM ____________________________________________  INITIAL IMPRESSION / ASSESSMENT AND PLAN / ED COURSE  Patient presents to the ED for evaluation of sinus sugar, sore throat, and fevers.  Patient's exam likely represents a bronchitis and sore throat.  No indication of any strep pharyngitis on exam.  She will be treated empirically however with a azithromycin for potential of a community-acquired pneumonia.  He will also be discharged with a Flonase to dose as directed.  She may doze over-the-counter allergy medicine and decongestant as necessary. ____________________________________________  FINAL CLINICAL IMPRESSION(S) / ED DIAGNOSES  Final diagnoses:  Sore throat  Bronchitis      Aerith Canal, Charlesetta IvoryJenise V Bacon, PA-C 06/14/17 2027    Willy Eddyobinson, Patrick, MD 06/17/17 1032

## 2017-06-14 NOTE — ED Notes (Signed)
See triage note  Presents with sinus pressure and sore throat which started 2 days ago   Afebrile on arrival

## 2018-02-26 ENCOUNTER — Other Ambulatory Visit: Payer: Self-pay

## 2018-02-26 ENCOUNTER — Encounter: Payer: Self-pay | Admitting: Emergency Medicine

## 2018-02-26 DIAGNOSIS — R102 Pelvic and perineal pain: Secondary | ICD-10-CM | POA: Diagnosis present

## 2018-02-26 DIAGNOSIS — Z79899 Other long term (current) drug therapy: Secondary | ICD-10-CM | POA: Diagnosis not present

## 2018-02-26 DIAGNOSIS — N941 Unspecified dyspareunia: Secondary | ICD-10-CM | POA: Insufficient documentation

## 2018-02-26 DIAGNOSIS — N760 Acute vaginitis: Secondary | ICD-10-CM | POA: Diagnosis not present

## 2018-02-26 DIAGNOSIS — F17210 Nicotine dependence, cigarettes, uncomplicated: Secondary | ICD-10-CM | POA: Diagnosis not present

## 2018-02-26 LAB — URINALYSIS, ROUTINE W REFLEX MICROSCOPIC
BILIRUBIN URINE: NEGATIVE
GLUCOSE, UA: NEGATIVE mg/dL
KETONES UR: NEGATIVE mg/dL
LEUKOCYTES UA: NEGATIVE
NITRITE: NEGATIVE
PH: 6 (ref 5.0–8.0)
Protein, ur: NEGATIVE mg/dL
SPECIFIC GRAVITY, URINE: 1.008 (ref 1.005–1.030)

## 2018-02-26 LAB — POCT PREGNANCY, URINE: Preg Test, Ur: NEGATIVE

## 2018-02-26 NOTE — ED Triage Notes (Signed)
Pt arrives ambulatory to triage with c/o pelvic pain x 1 1/2 month. Pt reports that last week she was seen by her doctor and given antibiotics for UTI. Pt reports painful intercourse this evening without any use of sexual aids. Pt states "there was no anal just regular sex". Pt is in NAD.

## 2018-02-27 ENCOUNTER — Emergency Department: Payer: Medicaid Other

## 2018-02-27 ENCOUNTER — Emergency Department
Admission: EM | Admit: 2018-02-27 | Discharge: 2018-02-27 | Disposition: A | Discharge status: Home / Self Care | Payer: Medicaid Other | Attending: Emergency Medicine | Admitting: Emergency Medicine

## 2018-02-27 DIAGNOSIS — N760 Acute vaginitis: Secondary | ICD-10-CM

## 2018-02-27 DIAGNOSIS — B9689 Other specified bacterial agents as the cause of diseases classified elsewhere: Secondary | ICD-10-CM

## 2018-02-27 DIAGNOSIS — N941 Unspecified dyspareunia: Secondary | ICD-10-CM

## 2018-02-27 LAB — CHLAMYDIA/NGC RT PCR (ARMC ONLY)
Chlamydia Tr: NOT DETECTED
N gonorrhoeae: NOT DETECTED

## 2018-02-27 LAB — WET PREP, GENITAL
Sperm: NONE SEEN
TRICH WET PREP: NONE SEEN
Yeast Wet Prep HPF POC: NONE SEEN

## 2018-02-27 MED ORDER — METRONIDAZOLE 500 MG PO TABS: 500.0000 mg | ORAL_TABLET | Freq: Two times a day (BID) | ORAL | 0 refills | Status: AC

## 2018-02-27 MED ORDER — PHENAZOPYRIDINE HCL 200 MG PO TABS: 200.0000 mg | ORAL_TABLET | Freq: Three times a day (TID) | ORAL | 0 refills | Status: DC | PRN

## 2018-02-27 MED ORDER — PHENAZOPYRIDINE HCL 200 MG PO TABS
200.0000 mg | ORAL_TABLET | Freq: Once | ORAL | Status: AC
  Administered 2018-02-27: 200 mg via ORAL
  Filled 2018-02-27: qty 1

## 2018-02-27 NOTE — ED Provider Notes (Signed)
[facility] Emergency Department Provider Note  ____________________________________________   First MD Initiated Contact with Patient 02/27/18 [phone]     (approximate)  I have reviewed the triage vital signs and the nursing notes.   HISTORY  Chief Complaint Pelvic Pain   HPI Annette Cline is a 32 y.o. female who self presents to the emergency department with about 6 weeks of pelvic pain.  She went to urgent care recently and had a urinalysis which according to the patient showed that she "might have an infection" so she was prescribed Macrobid which has not been improving her symptoms.  She had penetrative vaginal intercourse with her partner at this evening and sustained dyspareunia which prompted the visit today.  The pain is described as throbbing and aching within her vagina.  No bleeding.  She denies dysuria frequency or hesitancy.  She has mild lower back pain.  No fevers or chills.  She did not use lubrication but said that she was not particularly dry and the sex was nothing out of the ordinary.    Past Medical History:  Diagnosis Date  . 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 Preterm labor     Patient Active Problem List   Diagnosis Date Noted  . Acute appendicitis   . Abdominal pain 09/18/2016  . Indication for care in labor or delivery 02/07/2016  . Insufficient prenatal care 02/06/2016  . Supervision of high-risk pregnancy, third trimester 02/06/2016  . Rupture of membranes with clear amniotic fluid 02/06/2016  . Labor and delivery indication for care or intervention 02/07/2015    Past Surgical History:  Procedure Laterality Date  . CERVICAL CONIZATION W/BX N/A 04/17/2015   Procedure: CONIZATION CERVIX WITH BIOPSY;  Surgeon: Elenora Fender Ward, MD;  Location: [facility] ORS;  Service: Gynecology;  Laterality: N/A;  . LAPAROSCOPIC APPENDECTOMY N/A 09/19/2016   Procedure: APPENDECTOMY LAPAROSCOPIC;  Surgeon: Henrene Dodge,  MD;  Location: [facility] ORS;  Service: General;  Laterality: N/A;  . TONSILLECTOMY      Prior to Admission medications   Medication Sig Start Date End Date Taking? Authorizing Provider  azithromycin (ZITHROMAX Z-PAK) 250 MG tablet Take 2 tablets (500 mg) on  Day 1,  followed by 1 tablet (250 mg) once daily on Days 2 through 5. 06/14/17   Menshew, Charlesetta Ivory, PA-C  calcium carbonate (TUMS EX) 750 MG chewable tablet Chew 1 tablet by mouth daily as needed for heartburn.    [provider]  fluticasone (FLONASE) 50 MCG/ACT nasal spray Place 2 sprays into both nostrils daily. 06/14/17   Menshew, Charlesetta Ivory, PA-C  ibuprofen (ADVIL,MOTRIN) 200 MG tablet Take 800 mg by mouth every 8 (eight) hours as needed.    [provider]  metroNIDAZOLE (FLAGYL) 500 MG tablet Take 1 tablet (500 mg total) by mouth 2 (two) times daily for 7 days. 02/27/18 03/06/18  Merrily Brittle, MD  phenazopyridine (PYRIDIUM) 200 MG tablet Take 1 tablet (200 mg total) by mouth 3 (three) times daily as needed for pain. 02/27/18 02/27/19  Merrily Brittle, MD    Allergies Sulfa antibiotics  Family History  Problem Relation Age of Onset  . Cancer Mother   . Diabetes Mother   . Cancer Maternal Grandmother   . Hyperlipidemia Maternal Grandmother     Social History Social History   Tobacco Use  . Smoking status: Current Every Day Smoker    Packs/day: 1.00    Types: Cigarettes  .  Smokeless tobacco: Never Used  . Tobacco comment: smoked 1 PPD prior to pregnancy and 2 cigs/day with pregnancy  Substance Use Topics  . Alcohol use: No  . Drug use: No    Review of Systems Constitutional: No fever/chills Eyes: No visual changes. ENT: No sore throat. Cardiovascular: Denies chest pain. Respiratory: Denies shortness of breath. Gastrointestinal: No abdominal pain.  No nausea, no vomiting.  No diarrhea.  No constipation. Genitourinary: Positive for dyspareunia Musculoskeletal: Positive for back pain. Skin:  Negative for rash. Neurological: Negative for headaches, focal weakness or numbness.   ____________________________________________   PHYSICAL EXAM:  VITAL SIGNS: ED Triage Vitals  Enc Vitals Group     BP 02/26/18 2331 (!) 144/76     Pulse Rate 02/26/18 2331 81     Resp 02/26/18 2331 18     Temp 02/26/18 2331 97.8 F (36.6 C)     Temp Source 02/26/18 2331 Oral     SpO2 02/26/18 2331 100 %     Weight 02/26/18 2334 212 lb (96.2 kg)     Height 02/26/18 2334 5\' 5"  (1.651 m)     Head Circumference --      Peak Flow --      Pain Score --      Pain Loc --      Pain Edu? --      Excl. in GC? --     Constitutional: Alert and oriented x4 pleasant cooperative speaks full clear sentences no diaphoresis Eyes: PERRL EOMI. Head: Atraumatic. Nose: No congestion/rhinnorhea. Mouth/Throat: No trismus Neck: No stridor.   Cardiovascular: Normal rate, regular rhythm. Grossly normal heart sounds.  Good peripheral circulation. Respiratory: Normal respiratory effort.  No retractions. Lungs CTAB and moving good air Gastrointestinal: Soft nontender no peritonitis no McBurney's tenderness negative Rovsing's Pelvic exam performed with female tech chaperone normal external exam office closed physiologic discharge no cervical motion tenderness no lacerations visualized Musculoskeletal: No lower extremity edema   Neurologic:  Normal speech and language. No gross focal neurologic deficits are appreciated. Skin:  Skin is warm, dry and intact. No rash noted. Psychiatric: Mood and affect are normal. Speech and behavior are normal.    ____________________________________________   DIFFERENTIAL includes but not limited to  Vaginal laceration, cervical contusion, sexually transmitted infection, ovarian torsion ____________________________________________   LABS (all labs ordered are listed, but only abnormal results are displayed)  Labs Reviewed  WET PREP, GENITAL - Abnormal; Notable for the  following components:      Result Value   Clue Cells Wet Prep HPF POC PRESENT (*)    WBC, Wet Prep HPF POC RARE (*)    All other components within normal limits  URINALYSIS, ROUTINE W REFLEX MICROSCOPIC - Abnormal; Notable for the following components:   Color, Urine STRAW (*)    APPearance CLEAR (*)    Hgb urine dipstick MODERATE (*)    Bacteria, UA RARE (*)    All other components within normal limits  CHLAMYDIA/NGC RT PCR ([facility] ONLY)  POCT PREGNANCY, URINE    Lab work reviewed by me shows clue cells consistent with bacterial vaginosis otherwise unremarkable __________________________________________  EKG   ____________________________________________  RADIOLOGY  Pelvic ultrasound reviewed by me with no acute disease noted ____________________________________________   PROCEDURES  Procedure(s) performed: no  Procedures  Critical Care performed: no  ____________________________________________   INITIAL IMPRESSION / ASSESSMENT AND PLAN / ED COURSE  Pertinent labs & imaging results that were available during my care of the patient were reviewed by me  and considered in my medical decision making (see chart for details).   As part of my medical decision making, I reviewed the following data within the electronic MEDICAL RECORD NUMBER History obtained from family if available, nursing notes, old chart and ekg, as well as notes from prior ED visits.  The patient comes to the emergency department with 6 weeks of intermittent pelvic pain and acute onset dyspareunia.  Pelvic exam today is reassuring with physiologic discharge and does show bacterial vaginosis.  She does have some left-sided pelvic discomfort so I obtained a pelvic ultrasound which is fortunately reassuring.  Given a dose of Pyridium to see if that might help with her pelvic discomfort as well as Flagyl for the bacterial vaginosis but more importantly I will help her establish care with an OB gynecologist  which she has not seen in quite some time.  Strict return precautions have been given.      ____________________________________________   FINAL CLINICAL IMPRESSION(S) / ED DIAGNOSES  Final diagnoses:  Bacterial vaginosis  Dyspareunia in female      NEW MEDICATIONS STARTED DURING THIS VISIT:  Discharge Medication List as of 02/27/2018  5:46 AM    START taking these medications   Details  metroNIDAZOLE (FLAGYL) 500 MG tablet Take 1 tablet (500 mg total) by mouth 2 (two) times daily for 7 days., Starting Sat 02/27/2018, Until Sat 03/06/2018, Print    phenazopyridine (PYRIDIUM) 200 MG tablet Take 1 tablet (200 mg total) by mouth 3 (three) times daily as needed for pain., Starting Sat 02/27/2018, Until Sun 02/27/2019, Print         Note:  This document was prepared using Dragon voice recognition software and may include unintentional dictation errors.     Merrily Brittleifenbark, Noya Santarelli, MD 03/01/18 1019

## 2018-02-27 NOTE — Discharge Instructions (Addendum)
It was a pleasure to take care of you today, and thank you for coming to our emergency department.  If you have any questions or concerns before leaving please ask the nurse to grab me and I'm more than happy to go through your aftercare instructions again.  If you were prescribed any opioid pain medication today such as Norco, Vicodin, Percocet, morphine, hydrocodone, or oxycodone please make sure you do not drive when you are taking this medication as it can alter your ability to drive safely.  If you have any concerns once you are home that you are not improving or are in fact getting worse before you can make it to your follow-up appointment, please do not hesitate to call [phone] and come back for further evaluation.  Merrily Brittle, MD  Results for orders placed or performed during the hospital encounter of 02/27/18  Wet prep, genital  Result Value Ref Range   Yeast Wet Prep HPF POC NONE SEEN NONE SEEN   Trich, Wet Prep NONE SEEN NONE SEEN   Clue Cells Wet Prep HPF POC PRESENT (A) NONE SEEN   WBC, Wet Prep HPF POC RARE (A) NONE SEEN   Sperm NONE SEEN   Urinalysis, Routine w reflex microscopic  Result Value Ref Range   Color, Urine STRAW (A) YELLOW   APPearance CLEAR (A) CLEAR   Specific Gravity, Urine 1.008 1.005 - 1.030   pH 6.0 5.0 - 8.0   Glucose, UA NEGATIVE NEGATIVE mg/dL   Hgb urine dipstick MODERATE (A) NEGATIVE   Bilirubin Urine NEGATIVE NEGATIVE   Ketones, ur NEGATIVE NEGATIVE mg/dL   Protein, ur NEGATIVE NEGATIVE mg/dL   Nitrite NEGATIVE NEGATIVE   Leukocytes, UA NEGATIVE NEGATIVE   RBC / HPF 0-5 0 - 5 RBC/hpf   WBC, UA 0-5 0 - 5 WBC/hpf   Bacteria, UA RARE (A) NONE SEEN   Squamous Epithelial / LPF 0-5 0 - 5  Pregnancy, urine POC  Result Value Ref Range   Preg Test, Ur NEGATIVE NEGATIVE   US Transvaginal Non-ob  Result Date: 02/27/2018 CLINICAL DATA:  Initial evaluation for acute left lower quadrant pain. EXAM: TRANSABDOMINAL AND TRANSVAGINAL ULTRASOUND OF PELVIS  DOPPLER ULTRASOUND OF OVARIES TECHNIQUE: Both transabdominal and transvaginal ultrasound examinations of the pelvis were performed. Transabdominal technique was performed for global imaging of the pelvis including uterus, ovaries, adnexal regions, and pelvic cul-de-sac. It was necessary to proceed with endovaginal exam following the transabdominal exam to visualize the uterus, endometrium, and ovaries. Color and duplex Doppler ultrasound was utilized to evaluate blood flow to the ovaries. COMPARISON:  Prior CT from 09/18/2016 FINDINGS: Uterus Measurements: 8.5 x 5.5 x 5.7 cm = volume: 138.5 mL. No fibroids or other mass visualized. Endometrium Thickness: 39.5.  No focal abnormality visualized. Right ovary Measurements: 3.9 x 2.7 x 2.5 cm = volume: 13.7 mL. Normal appearance/no adnexal mass. Note made of a 2 cm degenerating corpus luteal cyst. Left ovary Measurements: 3.3 x 2.2 x 2.6 cm = volume: 10.0 mL. Normal appearance/no adnexal mass. Pulsed Doppler evaluation of both ovaries demonstrates normal low-resistance arterial and venous waveforms. Other findings No abnormal free fluid. IMPRESSION: 1. Markedly thickened endometrium measuring up to approximately 40 mm. Endometrial thickness is considered abnormal. Consider follow-up by [place] in 6-8 weeks, during the week immediately following menses (exam timing is critical). 2. 2 cm degenerating right ovarian corpus luteal cyst. 3. Otherwise unremarkable pelvic ultrasound. No evidence for torsion. Electronically Signed   By: Rise Mu M.D.   On:  02/27/2018 05:21   [place] Pelvis Complete  Result Date: 02/27/2018 CLINICAL DATA:  Initial evaluation for acute left lower quadrant pain. EXAM: TRANSABDOMINAL AND TRANSVAGINAL ULTRASOUND OF PELVIS DOPPLER ULTRASOUND OF OVARIES TECHNIQUE: Both transabdominal and transvaginal ultrasound examinations of the pelvis were performed. Transabdominal technique was performed for global imaging of the pelvis including uterus,  ovaries, adnexal regions, and pelvic cul-de-sac. It was necessary to proceed with endovaginal exam following the transabdominal exam to visualize the uterus, endometrium, and ovaries. Color and duplex Doppler ultrasound was utilized to evaluate blood flow to the ovaries. COMPARISON:  Prior CT from 09/18/2016 FINDINGS: Uterus Measurements: 8.5 x 5.5 x 5.7 cm = volume: 138.5 mL. No fibroids or other mass visualized. Endometrium Thickness: 39.5.  No focal abnormality visualized. Right ovary Measurements: 3.9 x 2.7 x 2.5 cm = volume: 13.7 mL. Normal appearance/no adnexal mass. Note made of a 2 cm degenerating corpus luteal cyst. Left ovary Measurements: 3.3 x 2.2 x 2.6 cm = volume: 10.0 mL. Normal appearance/no adnexal mass. Pulsed Doppler evaluation of both ovaries demonstrates normal low-resistance arterial and venous waveforms. Other findings No abnormal free fluid. IMPRESSION: 1. Markedly thickened endometrium measuring up to approximately 40 mm. Endometrial thickness is considered abnormal. Consider follow-up by [place] in 6-8 weeks, during the week immediately following menses (exam timing is critical). 2. 2 cm degenerating right ovarian corpus luteal cyst. 3. Otherwise unremarkable pelvic ultrasound. No evidence for torsion. Electronically Signed   By: Rise MuBenjamin  McClintock M.D.   On: 02/27/2018 05:21   [place] Art/ven Flow Abd Pelv Doppler  Result Date: 02/27/2018 CLINICAL DATA:  Initial evaluation for acute left lower quadrant pain. EXAM: TRANSABDOMINAL AND TRANSVAGINAL ULTRASOUND OF PELVIS DOPPLER ULTRASOUND OF OVARIES TECHNIQUE: Both transabdominal and transvaginal ultrasound examinations of the pelvis were performed. Transabdominal technique was performed for global imaging of the pelvis including uterus, ovaries, adnexal regions, and pelvic cul-de-sac. It was necessary to proceed with endovaginal exam following the transabdominal exam to visualize the uterus, endometrium, and ovaries. Color and duplex Doppler  ultrasound was utilized to evaluate blood flow to the ovaries. COMPARISON:  Prior CT from 09/18/2016 FINDINGS: Uterus Measurements: 8.5 x 5.5 x 5.7 cm = volume: 138.5 mL. No fibroids or other mass visualized. Endometrium Thickness: 39.5.  No focal abnormality visualized. Right ovary Measurements: 3.9 x 2.7 x 2.5 cm = volume: 13.7 mL. Normal appearance/no adnexal mass. Note made of a 2 cm degenerating corpus luteal cyst. Left ovary Measurements: 3.3 x 2.2 x 2.6 cm = volume: 10.0 mL. Normal appearance/no adnexal mass. Pulsed Doppler evaluation of both ovaries demonstrates normal low-resistance arterial and venous waveforms. Other findings No abnormal free fluid. IMPRESSION: 1. Markedly thickened endometrium measuring up to approximately 40 mm. Endometrial thickness is considered abnormal. Consider follow-up by US in 6-8 weeks, during the week immediately following menses (exam timing is critical). 2. 2 cm degenerating right ovarian corpus luteal cyst. 3. Otherwise unremarkable pelvic ultrasound. No evidence for torsion. Electronically Signed   By: Rise MuBenjamin  McClintock M.D.   On: 02/27/2018 05:21

## 2018-02-27 NOTE — ED Notes (Signed)
Patient transported to Ultrasound 

## 2018-07-19 ENCOUNTER — Encounter: Payer: Self-pay | Admitting: Emergency Medicine

## 2018-07-19 ENCOUNTER — Other Ambulatory Visit: Payer: Self-pay

## 2018-07-19 ENCOUNTER — Emergency Department
Admission: EM | Admit: 2018-07-19 | Discharge: 2018-07-19 | Disposition: A | Discharge status: Home / Self Care | Payer: Medicaid Other | Attending: Emergency Medicine | Admitting: Emergency Medicine

## 2018-07-19 ENCOUNTER — Emergency Department: Payer: Medicaid Other

## 2018-07-19 DIAGNOSIS — F17210 Nicotine dependence, cigarettes, uncomplicated: Secondary | ICD-10-CM | POA: Insufficient documentation

## 2018-07-19 DIAGNOSIS — R102 Pelvic and perineal pain: Secondary | ICD-10-CM | POA: Diagnosis present

## 2018-07-19 DIAGNOSIS — R9389 Abnormal findings on diagnostic imaging of other specified body structures: Secondary | ICD-10-CM | POA: Insufficient documentation

## 2018-07-19 LAB — POCT PREGNANCY, URINE: Preg Test, Ur: NEGATIVE

## 2018-07-19 LAB — CBC
HCT: 41.1 % (ref 36.0–46.0)
Hemoglobin: 13.3 g/dL (ref 12.0–15.0)
MCH: 28.1 pg (ref 26.0–34.0)
MCHC: 32.4 g/dL (ref 30.0–36.0)
MCV: 86.9 fL (ref 80.0–100.0)
Platelets: 306 10*3/uL (ref 150–400)
RBC: 4.73 MIL/uL (ref 3.87–5.11)
RDW: 12.6 % (ref 11.5–15.5)
WBC: 12 10*3/uL — ABNORMAL HIGH (ref 4.0–10.5)
nRBC: 0 % (ref 0.0–0.2)

## 2018-07-19 LAB — URINALYSIS, COMPLETE (UACMP) WITH MICROSCOPIC
Bacteria, UA: NONE SEEN
Bilirubin Urine: NEGATIVE
Glucose, UA: NEGATIVE mg/dL
Hgb urine dipstick: NEGATIVE
Ketones, ur: 5 mg/dL — AB
Leukocytes,Ua: NEGATIVE
Nitrite: NEGATIVE
Protein, ur: NEGATIVE mg/dL
Specific Gravity, Urine: 1.032 — ABNORMAL HIGH (ref 1.005–1.030)
pH: 7 (ref 5.0–8.0)

## 2018-07-19 LAB — COMPREHENSIVE METABOLIC PANEL
ALT: 14 U/L (ref 0–44)
AST: 18 U/L (ref 15–41)
Albumin: 4 g/dL (ref 3.5–5.0)
Alkaline Phosphatase: 58 U/L (ref 38–126)
Anion gap: 7 (ref 5–15)
BUN: 11 mg/dL (ref 6–20)
CO2: 23 mmol/L (ref 22–32)
Calcium: 8.6 mg/dL — ABNORMAL LOW (ref 8.9–10.3)
Chloride: 106 mmol/L (ref 98–111)
Creatinine, Ser: 0.77 mg/dL (ref 0.44–1.00)
GFR calc Af Amer: >60 mL/min (ref >60)
GFR calc non Af Amer: >60 mL/min (ref >60)
Glucose, Bld: 136 mg/dL — ABNORMAL HIGH (ref 70–99)
Potassium: 3.3 mmol/L — ABNORMAL LOW (ref 3.5–5.1)
Sodium: 136 mmol/L (ref 135–145)
Total Bilirubin: 0.4 mg/dL (ref 0.3–1.2)
Total Protein: 7.3 g/dL (ref 6.5–8.1)

## 2018-07-19 LAB — LIPASE, BLOOD: Lipase: 22 U/L (ref 11–51)

## 2018-07-19 MED ORDER — SODIUM CHLORIDE 0.9% FLUSH: 3.0000 mL | Freq: Once | INTRAVENOUS | Status: DC

## 2018-07-19 NOTE — ED Triage Notes (Signed)
Lower abdominal pain began this am and increasing since.

## 2018-07-19 NOTE — ED Provider Notes (Signed)
[facility] Emergency Department Provider Note ____________________________________________   First MD Initiated Contact with Patient 07/19/18 1315     (approximate)  I have reviewed the triage vital signs and the nursing notes.   HISTORY  Chief Complaint Abdominal Pain    HPI Annette Cline is a 33 y.o. female with PMH as noted below who presents with lower abdominal/pelvic pain, acute onset approximately 1 hour prior to coming to hospital, and now mostly resolved after she took ibuprofen.  She reports associated nausea but no vomiting.  She denies any diarrhea or change in her bowel movements.  She has no vaginal bleeding or discharge, and no urinary symptoms.  She states that her last period was on April 10 and was normal for her.  She states that she has had some pain similar to this before when she had an ovarian cyst that ruptured.  She denies any cough, fever, or shortness of breath.   Past Medical History:  Diagnosis Date  . 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 Preterm labor     Patient Active Problem List   Diagnosis Date Noted  . Acute appendicitis   . Abdominal pain 09/18/2016  . Indication for care in labor or delivery 02/07/2016  . Insufficient prenatal care 02/06/2016  . Supervision of high-risk pregnancy, third trimester 02/06/2016  . Rupture of membranes with clear amniotic fluid 02/06/2016  . Labor and delivery indication for care or intervention 02/07/2015    Past Surgical History:  Procedure Laterality Date  . CERVICAL CONIZATION W/BX N/A 04/17/2015   Procedure: CONIZATION CERVIX WITH BIOPSY;  Surgeon: Elenora Fender Ward, MD;  Location: [facility] ORS;  Service: Gynecology;  Laterality: N/A;  . LAPAROSCOPIC APPENDECTOMY N/A 09/19/2016   Procedure: APPENDECTOMY LAPAROSCOPIC;  Surgeon: Henrene Dodge, MD;  Location: [facility] ORS;  Service: General;  Laterality: N/A;  . TONSILLECTOMY      Prior to Admission  medications   Not on File    Allergies Sulfa antibiotics  Family History  Problem Relation Age of Onset  . Cancer Mother   . Diabetes Mother   . Cancer Maternal Grandmother   . Hyperlipidemia Maternal Grandmother     Social History Social History   Tobacco Use  . Smoking status: Current Every Day Smoker    Packs/day: 1.00    Types: Cigarettes  . Smokeless tobacco: Never Used  . Tobacco comment: smoked 1 PPD prior to pregnancy and 2 cigs/day with pregnancy  Substance Use Topics  . Alcohol use: No  . Drug use: No    Review of Systems  Constitutional: No fever. Eyes: No redness. ENT: No sore throat. Cardiovascular: Denies chest pain. Respiratory: Denies shortness of breath. Gastrointestinal: Positive for nausea. Genitourinary: Negative for dysuria.  Negative for vaginal bleeding or discharge. Musculoskeletal: Negative for back pain. Skin: Negative for rash. Neurological: Negative for headache.   ____________________________________________   PHYSICAL EXAM:  VITAL SIGNS: ED Triage Vitals  Enc Vitals Group     BP 07/19/18 1232 122/74     Pulse Rate 07/19/18 1232 87     Resp 07/19/18 1232 18     Temp 07/19/18 1232 98.2 F (36.8 C)     Temp Source 07/19/18 1232 Oral     SpO2 07/19/18 1232 100 %     Weight 07/19/18 1233 215 lb (97.5 kg)     Height 07/19/18 1233 5\' 5"  (1.651 m)     Head Circumference --  Peak Flow --      Pain Score 07/19/18 1233 10     Pain Loc --      Pain Edu? --      Excl. in GC? --     Constitutional: Alert and oriented. Well appearing and in no acute distress. Eyes: Conjunctivae are normal.  Head: Atraumatic. Nose: No congestion/rhinnorhea. Mouth/Throat: Mucous membranes are moist.   Neck: Normal range of motion.  Cardiovascular: Good peripheral circulation. Respiratory: Normal respiratory effort.   Gastrointestinal: Mild suprapubic tenderness.  No distention.  Genitourinary: No flank tenderness. Musculoskeletal:  Extremities warm and well perfused.  Neurologic:  Normal speech and language. No gross focal neurologic deficits are appreciated.  Skin:  Skin is warm and dry. No rash noted. Psychiatric: Mood and affect are normal. Speech and behavior are normal.  ____________________________________________   LABS (all labs ordered are listed, but only abnormal results are displayed)  Labs Reviewed  COMPREHENSIVE METABOLIC PANEL - Abnormal; Notable for the following components:      Result Value   Potassium 3.3 (*)    Glucose, Bld 136 (*)    Calcium 8.6 (*)    All other components within normal limits  CBC - Abnormal; Notable for the following components:   WBC 12.0 (*)    All other components within normal limits  URINALYSIS, COMPLETE (UACMP) WITH MICROSCOPIC - Abnormal; Notable for the following components:   Color, Urine YELLOW (*)    APPearance HAZY (*)    Specific Gravity, Urine 1.032 (*)    Ketones, ur 5 (*)    All other components within normal limits  LIPASE, BLOOD  POC URINE PREG, ED  POCT PREGNANCY, URINE   ____________________________________________  EKG   ____________________________________________  RADIOLOGY  US pelvis: Thickened endometrium with no other acute abnormality  ____________________________________________   PROCEDURES  Procedure(s) performed: No  Procedures  Critical Care performed: No ____________________________________________   INITIAL IMPRESSION / ASSESSMENT AND PLAN / ED COURSE  Pertinent labs & imaging results that were available during my care of the patient were reviewed by me and considered in my medical decision making (see chart for details).  33 year old female with PMH as noted above presents with lower abdominal/pelvic pain, acute onset earlier today and now mostly improved after she took ibuprofen.  She denies associated vaginal bleeding, discharge, or any urinary symptoms.  She has had no vomiting or fever.  I reviewed the  past medical records in epic.  Patient was most recently seen in the ED in December of last year with pelvic pain over the few weeks.  She had an ultrasound which showed a thickened endometrium but no other acute findings, and was diagnosed with bacterial vaginosis.  She was admitted in 2018 with appendicitis.  On exam today she is well-appearing.  Her vital signs are normal.  The abdomen is soft with mild bilateral suprapubic tenderness.  Overall presentation is most consistent with pelvic etiology including a ruptured ovarian cyst, mittelschmerz, endometriosis, or fibroid.  Given the patient's overall well appearance and the improved pain I have a low suspicion for torsion.  The patient denies any discharge or bleeding.  Patient was offered but declined a pelvic exam.  We will obtain labs, UA, and a pelvic ultrasound to further evaluate.  ----------------------------------------- 3:24 PM on 07/19/2018 -----------------------------------------  Ultrasound shows thickened endometrium with no other acute findings.  The lab work-up and UA are unremarkable.  Given the patient's age this is most suggestive of endometriosis or other relatively benign cause.  However she will definitely need close OB/GYN follow-up.  She states that she goes to [facility] and can follow-up there.  I instructed her to make an appointment as soon as possible.  She can continue to treat the pain with ibuprofen.  Return precautions given, and she expresses understanding. ____________________________________________   FINAL CLINICAL IMPRESSION(S) / ED DIAGNOSES  Final diagnoses:  Pelvic pain  Thickened endometrium      NEW MEDICATIONS STARTED DURING THIS VISIT:  New Prescriptions   No medications on file     Note:  This document was prepared using Dragon voice recognition software and may include unintentional dictation errors.    Dionne Bucy, MD 07/19/18 1525

## 2018-07-19 NOTE — Discharge Instructions (Addendum)
You can continue to take ibuprofen up to 600 mg every 6 hours as needed for pain.  Make an appointment to follow-up with [facility] as soon as possible.  Your ultrasound is showing a thick endometrium (lining of the uterus).  This can be from several different causes.  You need to follow-up with an OB/GYN within the next few weeks.  Return to the ER for new, worsening, or persistent severe pain, vomiting, fever, weakness, or any other new or worsening symptoms that concern you.

## 2018-07-19 NOTE — ED Notes (Signed)
Patient transported to Ultrasound 

## 2018-07-28 ENCOUNTER — Other Ambulatory Visit: Payer: Self-pay

## 2018-07-28 ENCOUNTER — Encounter: Payer: Self-pay | Admitting: Obstetrics and Gynecology

## 2018-07-28 ENCOUNTER — Encounter: Payer: Medicaid Other | Admitting: Obstetrics and Gynecology

## 2018-07-28 NOTE — Progress Notes (Signed)
Entered in error

## 2018-08-02 ENCOUNTER — Encounter: Payer: Self-pay | Admitting: Obstetrics & Gynecology

## 2018-08-02 ENCOUNTER — Other Ambulatory Visit: Payer: Self-pay

## 2018-08-02 ENCOUNTER — Ambulatory Visit (INDEPENDENT_AMBULATORY_CARE_PROVIDER_SITE_OTHER): Payer: Medicaid Other | Admitting: Obstetrics & Gynecology

## 2018-08-02 DIAGNOSIS — N83202 Unspecified ovarian cyst, left side: Secondary | ICD-10-CM

## 2018-08-02 DIAGNOSIS — R102 Pelvic and perineal pain: Secondary | ICD-10-CM

## 2018-08-02 DIAGNOSIS — D069 Carcinoma in situ of cervix, unspecified: Secondary | ICD-10-CM | POA: Insufficient documentation

## 2018-08-02 NOTE — Progress Notes (Signed)
Virtual Visit via Telephone Note  I connected with Annette Cline on 08/02/18 at 10:30 AM EDT by telephone and verified that I am speaking with the correct person using two identifiers.   I discussed the limitations, risks, security and privacy concerns of performing an evaluation and management service by telephone and the availability of in person appointments. I also discussed with the patient that there may be a patient responsible charge related to this service. The patient expressed understanding and agreed to proceed. She was at home and I was in my office.  History of Present Illness:  Gynecology Pelvic Pain Evaluation   Chief Complaint  Patient presents with  . Follow-up    ER follow up, pain is completely controlled. Pt states every now and then severe pain and cramping    History of Present Illness:   Patient is a 33 y.o. [id number] who LMP was No LMP recorded., presents today for a problem visit.  She complains of pain.   Her pain is localized to the deep pelvis area, described as intermittent, began several months ago and its severity is described as severe. The pain radiates to the  Non-radiating. She has these associated symptoms which include none. Patient has these modifiers which include relaxation, pain medication, lying down and heating pad that make it better and unable to associate with any factor that make it worse.  Context includes: spontaneous.  No assoc w periods, sex, other activities. Periods reg, light usually.  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 Also,  has a past surgical history that includes Tonsillectomy; Cervical conization w/bx (N/A, 04/17/2015); and laparoscopic appendectomy (N/A, 09/19/2016)., family history includes Cancer in her  maternal grandmother and mother; Diabetes in her mother; Hyperlipidemia in her maternal grandmother.,  reports that she has been smoking cigarettes. She has been smoking about 1.00 pack per day. She has never used smokeless tobacco. She reports that she does not drink alcohol or use drugs.  She currently has no medications in their medication list. Also, is allergic to sulfa antibiotics.  Review of Systems  Constitutional: Negative for chills, fever and malaise/fatigue.  HENT: Negative for congestion, sinus pain and sore throat.   Eyes: Negative for blurred vision and pain.  Respiratory: Negative for cough and wheezing.   Cardiovascular: Negative for chest pain and leg swelling.  Gastrointestinal: Negative for abdominal pain, constipation, diarrhea, heartburn, nausea and vomiting.  Genitourinary: Negative for dysuria, frequency, hematuria and urgency.  Musculoskeletal: Negative for back pain, joint pain, myalgias and neck pain.  Skin: Negative for itching and rash.  Neurological: Negative for dizziness, tremors and weakness.  Endo/Heme/Allergies: Does not bruise/bleed easily.  Psychiatric/Behavioral: Negative for depression. The patient is not nervous/anxious and does not have insomnia.     Observations/Objective: No exam today, due to telephone eVisit due to [facility] virus restriction on elective visits and procedures.  Prior visits reviewed along with ultrasounds/labs as indicated.  Assessment and Plan: 1. Left ovarian cyst Management options discussed    Hormones, Surgery, Exp mgt  2. Pelvic pain As pain is intermittent and only 3 episodes in last 5 mos, will watch for now    F/U US of cyst    Endometriosis, other etiology discussed  3. CIN III (cervical intraepithelial  neoplasia grade III) with severe dysplasia Needs PAP soon (LEEP 2017 Schedule with next appt to combine exposure encounters   Follow Up Instructions: One month w US and PAP   I discussed the assessment and  treatment plan with the patient. The patient was provided an opportunity to ask questions and all were answered. The patient agreed with the plan and demonstrated an understanding of the instructions.   The patient was advised to call back or seek an in-person evaluation if the symptoms worsen or if the condition fails to improve as anticipated.  I provided 18 minutes of non-face-to-face time during this encounter.   Letitia Libraobert Paul Amier Hoyt, MD [facility], [facility] 08/02/2018  11:20 AM

## 2018-09-08 ENCOUNTER — Other Ambulatory Visit: Payer: Self-pay

## 2018-09-08 ENCOUNTER — Other Ambulatory Visit (HOSPITAL_COMMUNITY)
Admission: RE | Admit: 2018-09-08 | Discharge: 2018-09-08 | Disposition: A | Discharge status: Home / Self Care | Payer: Medicaid Other | Source: Ambulatory Visit | Attending: Obstetrics & Gynecology | Admitting: Obstetrics & Gynecology

## 2018-09-08 ENCOUNTER — Ambulatory Visit (INDEPENDENT_AMBULATORY_CARE_PROVIDER_SITE_OTHER): Payer: Medicaid Other

## 2018-09-08 ENCOUNTER — Ambulatory Visit (INDEPENDENT_AMBULATORY_CARE_PROVIDER_SITE_OTHER): Payer: Medicaid Other | Admitting: Obstetrics & Gynecology

## 2018-09-08 ENCOUNTER — Encounter: Payer: Self-pay | Admitting: Obstetrics & Gynecology

## 2018-09-08 VITALS — BP 122/80 | Ht 65.0 in | Wt 205.0 lb

## 2018-09-08 DIAGNOSIS — N83202 Unspecified ovarian cyst, left side: Secondary | ICD-10-CM

## 2018-09-08 DIAGNOSIS — R102 Pelvic and perineal pain: Secondary | ICD-10-CM

## 2018-09-08 DIAGNOSIS — D069 Carcinoma in situ of cervix, unspecified: Secondary | ICD-10-CM

## 2018-09-08 NOTE — Progress Notes (Signed)
  HPI: Pt had cyst dx last month after noting pain. Pain was intermittent, lower pelvis, severe at times.  She has not been having more pain this month.  She has monthly cycles, light.  Breast feeding some still (33 yo).  Does not desire any hormonal contraception.  Ultrasound demonstrates no masses seen, cyst seen on left ovary, smaller than last [place]. No concerning endometrial findings. See below.  Also, prior LEEP for CIN III in 2017, due for follow up PAP  PMHx: She  has a past medical history of Abnormal cervical Pap smear with positive HPV DNA test and Preterm labor. Also,  has a past surgical history that includes Tonsillectomy; Cervical conization w/bx (N/A, 04/17/2015); and laparoscopic appendectomy (N/A, 09/19/2016)., family history includes Cancer in her maternal grandmother and mother; Diabetes in her mother; Hyperlipidemia in her maternal grandmother.,  reports that she has been smoking cigarettes. She has been smoking about 1.00 pack per day. She has never used smokeless tobacco. She reports that she does not drink alcohol or use drugs.  She currently has no medications in their medication list. Also, is allergic to sulfa antibiotics.  Review of Systems  All other systems reviewed and are negative.   Objective: BP 122/80   Ht 5\' 5"  (1.651 m)   Wt 205 lb (93 kg)   LMP 09/07/2018   BMI 34.11 kg/m   Physical examination Constitutional NAD, Conversant  Skin No rashes, lesions or ulceration.   Extremities: Moves all appropriately.  Normal ROM for age. No lymphadenopathy.  Neuro: Grossly intact  Psych: Oriented to PPT.  Normal mood. Normal affect.   US Pelvic Complete With Transvaginal  Result Date: 09/08/2018 Patient Name: Annette Cline DOB: 1985/10/12 MRN: [id number] ULTRASOUND REPORT Location: [facility] Date of Service: 09/08/2018 Indications:Pelvic Pain and irregular periods Findings: The uterus is retroverted and measures 10.0 x 6.1 x 5.2cm. Echo texture is heterogenous  without evidence of focal masses. The Endometrium measures 10.0 mm. Slightly heterogeneous. Right Ovary measures 4.3 x 2.7 x 1.9 cm. It is normal in appearance. Left Ovary measures 3.5 x 2.6 x 2.1 cm with complex lesion, area of prior cyst measuring 1.4 x 1.3 x 1.1cm. Survey of the adnexa demonstrates no adnexal masses. There is no free fluid in the cul de sac. Impression: 1. Heterogeneous uterus and endometrium. Recommendations: 1.Clinical correlation with the patient's History and Physical Exam. Vita Barley, RT Review of ULTRASOUND.    I have personally reviewed images and report of recent ultrasound done at [facility].    Plan of management to be discussed with patient. Barnett Applebaum, MD, [facility], [facility] 09/08/2018  10:24 AM  Assessment:  1. Left ovarian cyst Stable, smaller, cont to monitor for sx's  2. Pelvic pain Resolved  3. CIN III (cervical intraepithelial neoplasia grade III) with severe dysplasia -    Plan: Cytology - PAP  4. Contraception- declines  A total of 15 minutes were spent face-to-face with the patient during this encounter and over half of that time dealt with counseling and coordination of care.  Barnett Applebaum, MD, [facility], [facility] 09/08/2018  10:25 AM

## 2018-09-14 LAB — CYTOLOGY - PAP
Diagnosis: NEGATIVE
HPV: NOT DETECTED

## 2020-09-08 ENCOUNTER — Observation Stay: Payer: Medicaid Other

## 2020-09-08 ENCOUNTER — Observation Stay
Admission: EM | Admit: 2020-09-08 | Discharge: 2020-09-08 | Disposition: A | Discharge status: Home / Self Care | Payer: Medicaid Other | Attending: Obstetrics and Gynecology | Admitting: Obstetrics and Gynecology

## 2020-09-08 ENCOUNTER — Encounter: Payer: Self-pay | Admitting: Obstetrics and Gynecology

## 2020-09-08 ENCOUNTER — Other Ambulatory Visit: Payer: Self-pay

## 2020-09-08 DIAGNOSIS — Z79899 Other long term (current) drug therapy: Secondary | ICD-10-CM | POA: Insufficient documentation

## 2020-09-08 DIAGNOSIS — O99333 Smoking (tobacco) complicating pregnancy, third trimester: Secondary | ICD-10-CM | POA: Insufficient documentation

## 2020-09-08 DIAGNOSIS — Z20822 Contact with and (suspected) exposure to covid-19: Secondary | ICD-10-CM | POA: Diagnosis not present

## 2020-09-08 DIAGNOSIS — F17210 Nicotine dependence, cigarettes, uncomplicated: Secondary | ICD-10-CM | POA: Diagnosis not present

## 2020-09-08 DIAGNOSIS — O4202 Full-term premature rupture of membranes, onset of labor within 24 hours of rupture: Principal | ICD-10-CM | POA: Insufficient documentation

## 2020-09-08 DIAGNOSIS — Z3A28 28 weeks gestation of pregnancy: Secondary | ICD-10-CM | POA: Diagnosis not present

## 2020-09-08 DIAGNOSIS — O0933 Supervision of pregnancy with insufficient antenatal care, third trimester: Secondary | ICD-10-CM

## 2020-09-08 HISTORY — DX: Supervision of pregnancy with insufficient antenatal care, third trimester: O09.33

## 2020-09-08 LAB — HEPATITIS B SURFACE ANTIGEN: Hepatitis B Surface Ag: NONREACTIVE

## 2020-09-08 LAB — TYPE AND SCREEN
ABO/RH(D): A POS
Antibody Screen: NEGATIVE

## 2020-09-08 LAB — CHLAMYDIA/NGC RT PCR (ARMC ONLY)
Chlamydia Tr: NOT DETECTED
N gonorrhoeae: NOT DETECTED

## 2020-09-08 LAB — WET PREP, GENITAL
Clue Cells Wet Prep HPF POC: NONE SEEN
Sperm: NONE SEEN
Trich, Wet Prep: NONE SEEN
Yeast Wet Prep HPF POC: NONE SEEN

## 2020-09-08 LAB — DIFFERENTIAL
Abs Immature Granulocytes: 0.04 10*3/uL (ref 0.00–0.07)
Basophils Absolute: 0 10*3/uL (ref 0.0–0.1)
Basophils Relative: 0 %
Eosinophils Absolute: 0.1 10*3/uL (ref 0.0–0.5)
Eosinophils Relative: 1 %
Immature Granulocytes: 0 %
Lymphocytes Relative: 30 %
Lymphs Abs: 2.8 10*3/uL (ref 0.7–4.0)
Monocytes Absolute: 0.7 10*3/uL (ref 0.1–1.0)
Monocytes Relative: 8 %
Neutro Abs: 5.6 10*3/uL (ref 1.7–7.7)
Neutrophils Relative %: 61 %

## 2020-09-08 LAB — CBC
HCT: 32 % — ABNORMAL LOW (ref 36.0–46.0)
Hemoglobin: 10.9 g/dL — ABNORMAL LOW (ref 12.0–15.0)
MCH: 29 pg (ref 26.0–34.0)
MCHC: 34.1 g/dL (ref 30.0–36.0)
MCV: 85.1 fL (ref 80.0–100.0)
Platelets: 297 10*3/uL (ref 150–400)
RBC: 3.76 MIL/uL — ABNORMAL LOW (ref 3.87–5.11)
RDW: 13 % (ref 11.5–15.5)
WBC: 9.2 10*3/uL (ref 4.0–10.5)
nRBC: 0 % (ref 0.0–0.2)

## 2020-09-08 LAB — RUPTURE OF MEMBRANE (ROM)PLUS: Rom Plus: POSITIVE

## 2020-09-08 LAB — URINE DRUG SCREEN, QUALITATIVE (ARMC ONLY)
Amphetamines, Ur Screen: NOT DETECTED
Barbiturates, Ur Screen: NOT DETECTED
Benzodiazepine, Ur Scrn: NOT DETECTED
Cannabinoid 50 Ng, Ur ~~LOC~~: NOT DETECTED
Cocaine Metabolite,Ur ~~LOC~~: NOT DETECTED
MDMA (Ecstasy)Ur Screen: NOT DETECTED
Methadone Scn, Ur: NOT DETECTED
Opiate, Ur Screen: NOT DETECTED
Phencyclidine (PCP) Ur S: NOT DETECTED
Tricyclic, Ur Screen: NOT DETECTED

## 2020-09-08 LAB — RAPID HIV SCREEN (HIV 1/2 AB+AG)
HIV 1/2 Antibodies: NONREACTIVE
HIV-1 P24 Antigen - HIV24: NONREACTIVE

## 2020-09-08 LAB — RESP PANEL BY RT-PCR (FLU A&B, COVID) ARPGX2
Influenza A by PCR: NEGATIVE
Influenza B by PCR: NEGATIVE
SARS Coronavirus 2 by RT PCR: NEGATIVE

## 2020-09-08 LAB — GROUP B STREP BY PCR: Group B strep by PCR: NEGATIVE

## 2020-09-08 MED ORDER — MAGNESIUM SULFATE 40 GM/1000ML IV SOLN
INTRAVENOUS | Status: AC
  Administered 2020-09-08: 4 g via INTRAVENOUS
  Filled 2020-09-08: qty 1000

## 2020-09-08 MED ORDER — AZITHROMYCIN 500 MG PO TABS
1000.0000 mg | ORAL_TABLET | Freq: Once | ORAL | Status: AC
  Administered 2020-09-08: 1000 mg via ORAL
  Filled 2020-09-08: qty 2

## 2020-09-08 MED ORDER — LACTATED RINGERS IV SOLN
INTRAVENOUS | Status: DC
  Administered 2020-09-08: 10 mL/h via INTRAVENOUS

## 2020-09-08 MED ORDER — DOCUSATE SODIUM 100 MG PO CAPS: 100.0000 mg | ORAL_CAPSULE | Freq: Every day | ORAL | Status: DC

## 2020-09-08 MED ORDER — PRENATAL MULTIVITAMIN CH: 1.0000 | ORAL_TABLET | Freq: Every day | ORAL | Status: DC

## 2020-09-08 MED ORDER — SODIUM CHLORIDE 0.9 % IV SOLN
2.0000 g | Freq: Four times a day (QID) | INTRAVENOUS | Status: DC
  Administered 2020-09-08 (×2): 2 g via INTRAVENOUS
  Filled 2020-09-08 (×2): qty 2000

## 2020-09-08 MED ORDER — BETAMETHASONE SOD PHOS & ACET 6 (3-3) MG/ML IJ SUSP: 12.0000 mg | INTRAMUSCULAR | Status: DC

## 2020-09-08 MED ORDER — CALCIUM CARBONATE ANTACID 500 MG PO CHEW: 2.0000 | CHEWABLE_TABLET | ORAL | Status: DC | PRN

## 2020-09-08 MED ORDER — ZOLPIDEM TARTRATE 5 MG PO TABS: 5.0000 mg | ORAL_TABLET | Freq: Every evening | ORAL | Status: DC | PRN

## 2020-09-08 MED ORDER — MAGNESIUM SULFATE BOLUS VIA INFUSION
4.0000 g | Freq: Once | INTRAVENOUS | Status: AC
  Filled 2020-09-08: qty 1000

## 2020-09-08 MED ORDER — ACETAMINOPHEN 325 MG PO TABS: 650.0000 mg | ORAL_TABLET | ORAL | Status: DC | PRN

## 2020-09-08 MED ORDER — BETAMETHASONE SOD PHOS & ACET 6 (3-3) MG/ML IJ SUSP
INTRAMUSCULAR | Status: AC
  Administered 2020-09-08: 12 mg via INTRAMUSCULAR
  Filled 2020-09-08: qty 5

## 2020-09-08 MED ORDER — AMOXICILLIN 500 MG PO CAPS
500.0000 mg | ORAL_CAPSULE | Freq: Three times a day (TID) | ORAL | Status: DC
  Filled 2020-09-08: qty 1

## 2020-09-08 MED ORDER — MAGNESIUM SULFATE 40 GM/1000ML IV SOLN
2.0000 g/h | INTRAVENOUS | Status: DC
  Administered 2020-09-08: 2 g/h via INTRAVENOUS

## 2020-09-08 NOTE — Consult Note (Signed)
Neonatology Consult to Antenatal Patient:  I was asked by Randa Ngo, CNM to see this patient in order to provide antenatal counseling due to prematurity.  [name] was admitted today at 85 4/[redacted] weeks GA. She is currently ruptured but not having active labor. She has gotten first dose BMZ and is receiving IV Mag and Azith/Ampicillin.  GBS pending.  Good fetal movement, no bleeding or infection concerns.  Annette Cline is a 35 y.o. [phone] female with no PNC this pregnancy and h/o prior 36 weeks births.  +tobacco use.   Prenatal Labs: Blood type/Rh A pos  Antibody screen neg  Rubella  pending  Varicella  pending  RPR  pending  HBsAg  pending  HIV NR  GC neg  Chlamydia neg  Genetic screening  Not done  1 hour GTT  Not done, A1C pending.   3 hour GTT    GBS  PCR pending    I spoke with the patient and her friend. We discussed the worst case of delivery in the next 1-2 days, including usual DR management, possible respiratory complications and need for support, IV access, feedings (mother desires breast feeding, which was encouraged, and use of donor was discussed), LOS, Mortality and Morbidity, and long term outcomes. She did have basic and usual questions at this time which I answered.   I reviewed reasons, at this time, for continuation of care with potential delivery at a higher level hospital.  After delivery, we would be glad to have baby transfer here when appropriate if they should so choose.    Thank you for asking me to see this patient.  Dineen Kid Leary Roca, MD Neonatologist  The total length of face-to-face or floor/unit time for this encounter was 25 minutes. Counseling and/or coordination of care was 40 minutes of the above.

## 2020-09-08 NOTE — Progress Notes (Signed)
Report given to [facility] team. Pt. Assisted to stretcher. VSS. FHT 135. McVey CNM cervical exam was unchanged. Transported in care of [facility] Med Team.

## 2020-09-08 NOTE — H&P (Signed)
OB History & Physical   History of Present Illness:  Chief Complaint: gush of fluid this am around 0400  HPI:  RHEBA DIAMOND is a 35 y.o. [id number] female at [redacted]w[redacted]d dated by LMP 02/21/20 and c/w [place] at 28wks.  She presents to L&D for large gush of clear fluid this morning around 4am. Denies UCs or VB. Active FM.   - NO PNC this pregnancy - hx cervical conization/LEEP 2017.   Pregnancy Issues: 1. Hx preterm birth x 2 at 36wks 2. 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 Authorizing Provider  Prenatal Vit-Fe Fumarate-FA (MULTIVITAMIN-PRENATAL) 27-0.8 MG TABS tablet Take 1 tablet by mouth daily at 12 noon.   Yes [provider]     Prenatal care site: no [facility]  Social History: She  reports that she has been smoking cigarettes. She has been smoking an average of 0.50 packs per day. She has never used smokeless tobacco. She reports that she does not drink alcohol and does not use drugs.  Family History: family history includes Cancer in her maternal grandmother and mother; Diabetes in her mother; Hyperlipidemia in her maternal grandmother.   Review of Systems: A full review of systems was performed and negative except as noted in the HPI.     Physical Exam:  Vital Signs: BP 108/66 (BP Location: Right Arm)    Pulse 73   Temp 98.6 F (37 C) (Oral)   Resp 18   Ht 5\' 5"  (1.651 m)   Wt 97.5 kg   LMP 02/21/2020 (Exact Date)   BMI 35.78 kg/m  General: no acute distress.  HEENT: normocephalic, atraumatic Heart: regular rate & rhythm.  No murmurs/rubs/gallops Lungs: clear to auscultation bilaterally, normal respiratory effort Abdomen: soft, gravid, non-tender Pelvic:   External: Normal external female genitalia  SSE: leaking scant amt clear fluid, yellow tinged frothy DC noted. No bleeding. Cx posterior, med, 1/50/-2   Extremities: non-tender, symmetric, no edema bilaterally.  DTRs: 2+  Neurologic: Alert & oriented x 3.    Results for orders placed or performed during the hospital encounter of 09/08/20 (from the past 24 hour(s))  Wet prep, genital     Status: Abnormal   Collection Time: 09/08/20  9:54 AM  Result Value Ref Range   Yeast Wet Prep HPF POC NONE SEEN NONE SEEN   Trich, Wet Prep NONE SEEN NONE SEEN   Clue Cells Wet Prep HPF POC NONE SEEN NONE SEEN   WBC, Wet Prep HPF POC FEW (A) NONE SEEN   Sperm NONE SEEN   CBC     Status: Abnormal   Collection Time: 09/08/20  9:54 AM  Result Value Ref Range   WBC 9.2 4.0 - 10.5 K/uL   RBC 3.76 (L) 3.87 - 5.11 MIL/uL  Hemoglobin 10.9 (L) 12.0 - 15.0 g/dL   HCT 44.9 (L) 20.1 - 00.7 %   MCV 85.1 80.0 - 100.0 fL   MCH 29.0 26.0 - 34.0 pg   MCHC 34.1 30.0 - 36.0 g/dL   RDW 12.1 97.5 - 88.3 %   Platelets 297 150 - 400 K/uL   nRBC 0.0 0.0 - 0.2 %  Differential     Status: None   Collection Time: 09/08/20  9:54 AM  Result Value Ref Range   Neutrophils Relative % 61 %   Neutro Abs 5.6 1.7 - 7.7 K/uL   Lymphocytes Relative 30 %   Lymphs Abs 2.8 0.7 - 4.0 K/uL   Monocytes Relative 8 %   Monocytes Absolute 0.7 0.1 - 1.0 K/uL   Eosinophils Relative 1 %   Eosinophils Absolute 0.1 0.0 - 0.5 K/uL   Basophils Relative 0 %   Basophils Absolute 0.0 0.0 - 0.1 K/uL   Immature Granulocytes 0 %   Abs Immature Granulocytes 0.04 0.00 - 0.07 K/uL   Type and screen [facility]     Status: None   Collection Time: 09/08/20  9:54 AM  Result Value Ref Range   ABO/RH(D) A POS    Antibody Screen NEG    Sample Expiration      09/11/2020,2359 Performed at [facility], [address] [place], [place] [address]   Rapid HIV screen (HIV 1/2 Ab+Ag)     Status: None   Collection Time: 09/08/20  9:54 AM  Result Value Ref Range   HIV-1 P24 Antigen - HIV24 NON REACTIVE NON REACTIVE   HIV 1/2 Antibodies NON REACTIVE NON REACTIVE   Interpretation (HIV Ag Ab)      A non reactive test result means that HIV 1 or HIV 2 antibodies and HIV 1 p24 antigen were not detected in the specimen.  ROM Plus ([facility] only)     Status: None   Collection Time: 09/08/20  9:57 AM  Result Value Ref Range   Rom Plus POSITIVE   Urine Drug Screen, Qualitative ([facility] only)     Status: None   Collection Time: 09/08/20  9:57 AM  Result Value Ref Range   Tricyclic, Ur Screen NONE DETECTED NONE DETECTED   Amphetamines, Ur Screen NONE DETECTED NONE DETECTED   MDMA (Ecstasy)Ur Screen NONE DETECTED NONE DETECTED   Cocaine Metabolite,Ur [place] NONE DETECTED NONE DETECTED   Opiate, Ur Screen NONE DETECTED NONE DETECTED   Phencyclidine (PCP) Ur S NONE DETECTED NONE DETECTED   Cannabinoid 50 Ng, Ur [place] NONE DETECTED NONE DETECTED   Barbiturates, Ur Screen NONE DETECTED NONE DETECTED   Benzodiazepine, Ur Scrn NONE DETECTED NONE DETECTED   Methadone Scn, Ur NONE DETECTED NONE DETECTED  Chlamydia/NGC rt PCR ([facility] only)     Status: None   Collection Time: 09/08/20  9:57 AM  Result Value Ref Range   Specimen source GC/Chlam ENDOCERVICAL    Chlamydia Tr NOT DETECTED NOT DETECTED   N gonorrhoeae NOT DETECTED NOT DETECTED  Resp Panel by RT-PCR (Flu A&B, Covid) Nasopharyngeal Swab     Status: None   Collection Time: 09/08/20 10:12 AM   Specimen: Nasopharyngeal Swab; Nasopharyngeal(NP) swabs in vial transport medium  Result Value Ref Range   SARS Coronavirus  2 by RT PCR NEGATIVE NEGATIVE   Influenza A by PCR NEGATIVE NEGATIVE   Influenza B by PCR NEGATIVE NEGATIVE    Pertinent Results:  Prenatal Labs: Blood type/Rh A pos  Antibody screen neg  Rubella  pending  Varicella  pending  RPR  pending  HBsAg  pending  HIV NR  GC neg  Chlamydia neg  Genetic screening  Not done  1 hour GTT  Not done, A1C pending.   3 hour GTT   GBS  PCR pending   FHT: 150bpm, mod variability, + accels 10*10, Variable decels intermittently TOCO: none SVE:  Dilation: 1 / Effacement (%): 50 /      Cephalic by [place]  US OB Comp + 14 Wk  Result Date: 09/08/2020 CLINICAL DATA:  No prenatal care.  Leaking fluid. EXAM: OBSTETRICAL ULTRASOUND >14 WKS FINDINGS: Number of Fetuses: 1 Heart Rate:  153 bpm Movement: Yes Presentation: Cephalic Previa: No Placental Location: Anterior Amniotic Fluid (Subjective): Decreased Amniotic Fluid (Objective): AFI = 1.5 cm (5%ile= 9.4 cm, 95%= 22.8 cm for 28 wks) FETAL BIOMETRY BPD: 7.1cm 28w 2d HC:   26.3cm 28w 4d AC:   23.6cm 27w 6d FL:   5.4cm 28w 4d Current Mean GA: 28w 3d [place] EDC: 11/28/2020 Estimated Fetal Weight:  1,192g 25%ile for 28 weeks FETAL ANATOMY Lateral Ventricles: Appears normal Thalami/CSP: Appears normal Posterior Fossa:  Appears normal Nuchal Region: Not visualized   NFT= N/A > 20 WKS Upper Lip: Not visualized Spine: Not visualized 4 Chamber Heart on Left: Appears normal LVOT: Not visualized RVOT: Not visualized Stomach on Left: Appears normal 3 Vessel Cord: Not visualized Cord Insertion site: Not visualized Kidneys: Appears normal Bladder: Appears normal Extremities: Appears normal Sex: Not Visualized Technically difficult due to: Oligohydramnios and fetal position Maternal Findings: Cervix:  3.2 cm TA IMPRESSION: Single living [name] with estimated gestational age of [redacted] weeks 3 days, and [place] EDC of 11/28/2020. Oligohydramnios. Very limited fetal anatomic survey due to low amniotic fluid and fetal position. Electronically Signed    By: Danae Orleans M.D.   On: 09/08/2020 11:26    Assessment:  ORIYAH LAMPHEAR is a 35 y.o. [id number] female at [redacted]w[redacted]d with PPROM.   Plan:  1. Admit to Labor & Delivery; consents reviewed and obtained - COVID screen negative.  - [facility] transfer [facility]tr called to request transfer to higher level of care, unable to accept pt transfer due to NICU closed.   - PPROM at 28wks: BMZ #1 given, Latency Abx started, Mag neuroprotection. started  2. Fetal Well being  - Fetal Tracing: Cat II, variable decels intermittently.  - Group B Streptococcus ppx indicated: Latency Abx ordered- Azithromycin PO and Ampicillin IV,  GBS by PCR completed.  - Presentation: cephalic confirmed by SVE and [place]   3. Routine OB: - Prenatal labs completed with pending noted as above - Rh A POS - CBC, T&S, RPR on admit - Clear fluids, IVF  4. Monitoring  - continuous EFM and toco     Randa Ngo, CNM 09/08/20 12:58 PM

## 2020-09-08 NOTE — Discharge Summary (Signed)
Patient ID: Annette Cline MRN: [id number] DOB/AGE: 1985/08/06 35 y.o.  Admit date: 09/08/2020 Discharge date: 09/08/2020  Admission Diagnoses: suspected ROM, no Prenatal care  Discharge Diagnoses: PPROM, 28.4wks  Prenatal Procedures: NST and ultrasound  Consults: Neonatology  Significant Diagnostic Studies:  Results for orders placed or performed during the hospital encounter of 09/08/20 (from the past 168 hour(s))  Wet prep, genital   Collection Time: 09/08/20  9:54 AM  Result Value Ref Range   Yeast Wet Prep HPF POC NONE SEEN NONE SEEN   Trich, Wet Prep NONE SEEN NONE SEEN   Clue Cells Wet Prep HPF POC NONE SEEN NONE SEEN   WBC, Wet Prep HPF POC FEW (A) NONE SEEN   Sperm NONE SEEN   Hepatitis B surface antigen   Collection Time: 09/08/20  9:54 AM  Result Value Ref Range   Hepatitis B Surface Ag NON REACTIVE NON REACTIVE  CBC   Collection Time: 09/08/20  9:54 AM  Result Value Ref Range   WBC 9.2 4.0 - 10.5 K/uL   RBC 3.76 (L) 3.87 - 5.11 MIL/uL   Hemoglobin 10.9 (L) 12.0 - 15.0 g/dL   HCT 81.8 (L) 29.9 - 37.1 %   MCV 85.1 80.0 - 100.0 fL   MCH 29.0 26.0 - 34.0 pg   MCHC 34.1 30.0 - 36.0 g/dL   RDW 69.6 78.9 - 38.1 %   Platelets 297 150 - 400 K/uL   nRBC 0.0 0.0 - 0.2 %  Differential   Collection Time: 09/08/20  9:54 AM  Result Value Ref Range   Neutrophils Relative % 61 %   Neutro Abs 5.6 1.7 - 7.7 K/uL   Lymphocytes Relative 30 %   Lymphs Abs 2.8 0.7 - 4.0 K/uL   Monocytes Relative 8 %   Monocytes Absolute 0.7 0.1 - 1.0 K/uL   Eosinophils Relative 1 %   Eosinophils Absolute 0.1 0.0 - 0.5 K/uL   Basophils Relative 0 %   Basophils Absolute 0.0 0.0 - 0.1 K/uL   Immature Granulocytes 0 %   Abs Immature Granulocytes 0.04 0.00 - 0.07 K/uL  Rapid HIV screen (HIV 1/2 Ab+Ag)   Collection Time: 09/08/20  9:54 AM  Result Value Ref Range   HIV-1 P24 Antigen - HIV24 NON REACTIVE NON REACTIVE   HIV 1/2 Antibodies NON REACTIVE NON REACTIVE   Interpretation (HIV Ag Ab)       A non reactive test result means that HIV 1 or HIV 2 antibodies and HIV 1 p24 antigen were not detected in the specimen.  Type and screen [facility]   Collection Time: 09/08/20  9:54 AM  Result Value Ref Range   ABO/RH(D) A POS    Antibody Screen NEG    Sample Expiration      09/11/2020,2359 Performed at [facility], [address] [place], [place] [address]   Chlamydia/NGC rt PCR [facility] only)   Collection Time: 09/08/20  9:57 AM  Result Value Ref Range   Specimen source GC/Chlam ENDOCERVICAL    Chlamydia Tr NOT DETECTED NOT DETECTED   N gonorrhoeae NOT DETECTED NOT DETECTED  ROM Plus ([facility] only)   Collection Time: 09/08/20  9:57 AM  Result Value Ref Range   Rom Plus POSITIVE   Urine Drug Screen, Qualitative ([facility] only)   Collection Time: 09/08/20  9:57 AM  Result Value Ref Range   Tricyclic, Ur Screen NONE DETECTED NONE DETECTED   Amphetamines, Ur Screen NONE DETECTED NONE DETECTED   MDMA (  Ecstasy)Ur Screen NONE DETECTED NONE DETECTED   Cocaine Metabolite,Ur [place] NONE DETECTED NONE DETECTED   Opiate, Ur Screen NONE DETECTED NONE DETECTED   Phencyclidine (PCP) Ur S NONE DETECTED NONE DETECTED   Cannabinoid 50 Ng, Ur [place] NONE DETECTED NONE DETECTED   Barbiturates, Ur Screen NONE DETECTED NONE DETECTED   Benzodiazepine, Ur Scrn NONE DETECTED NONE DETECTED   Methadone Scn, Ur NONE DETECTED NONE DETECTED  Resp Panel by RT-PCR (Flu A&B, Covid) Nasopharyngeal Swab   Collection Time: 09/08/20 10:12 AM   Specimen: Nasopharyngeal Swab; Nasopharyngeal(NP) swabs in vial transport medium  Result Value Ref Range   SARS Coronavirus 2 by RT PCR NEGATIVE NEGATIVE   Influenza A by PCR NEGATIVE NEGATIVE   Influenza B by PCR NEGATIVE NEGATIVE  Group B strep by PCR   Collection Time: 09/08/20  1:09 PM   Specimen: Vaginal/Rectal; Genital  Result Value Ref Range   Group B strep by PCR NEGATIVE NEGATIVE    Treatments: IV hydration, antibiotics: azithromycin  and ampicillin, steroids: betamethasone, procedures: ultrasound, and Magnesium for neuroprotection.   Hospital Course:  This is a 35 y.o. [id number] with [name] at [redacted]w[redacted]d admitted for PPROM, noted to have a cervical exam of 1/50/-2.  With LOF at 0400, no VB and no UCs. No PNC, EDD dating by sure LMP and confirmed with complete OB US done today.   She was initially started on magnesium sulfate for neuroprotection and also received betamethasone x 1st doses.  She was started on latency Abx with PO azithromycin and IV ampicillin.  She was seen by Neonatology during her stay.  She was observed, fetal heart rate monitoring remained reassuring, and she had no signs/symptoms of progressing preterm labor or other maternal-fetal concerns.  Her cervical exam was unchanged from admission. Due to prematurity, transfer to higher level of care was arranged, pt was accepted by [facility] and transported by their team.   Discharge Physical Exam:  BP 115/80 (BP Location: Right Arm)   Pulse 76   Temp 97.9 F (36.6 C) (Axillary)   Resp 16   Ht 5\' 5"  (1.651 m)   Wt 97.5 kg   LMP 02/21/2020 (Exact Date)   SpO2 100%   BMI 35.78 kg/m   General: NAD CV: RRR Pulm: CTABL, nl effort ABD: s/nd/nt, gravid DVT Evaluation: LE non-ttp, no evidence of DVT on exam.  SVE: 1/50/-2 FHT: 135bpm, mod variability, + accels, no decels TOCO: rare UC   Discharge Condition: Stable  Disposition: Discharge disposition: [facility]Another [facility] Care Institution Not Defined        Allergies as of 09/08/2020       Reactions   Sulfa Antibiotics Rash        Medication List     TAKE these medications    multivitamin-prenatal 27-0.8 MG Tabs tablet Take 1 tablet by mouth daily at 12 noon.         Signed: ----- 09/10/2020, CNM 09/08/2020  9:28 PM

## 2020-09-08 NOTE — Progress Notes (Signed)
Labor Progress Note  Annette Cline is a 35 y.o. [phone] at [redacted]w[redacted]d by LMP admitted for PPROM  Subjective: comfortable, no pain or bleeding.   Objective: BP (!) 111/56   Pulse 70   Temp 97.9 F (36.6 C) (Oral)   Resp 18   Ht 5\' 5"  (1.651 m)   Wt 97.5 kg   LMP 02/21/2020 (Exact Date)   SpO2 100%   BMI 35.78 kg/m  Notable VS details: reviewed.   Fetal Assessment: FHT:   FHR: 145 bpm, variability: moderate,  accelerations:  Present,  decelerations:  Present variable occasionally.  Category/reactivity:  Category II UC:   rare SVE:  last at 1258, 1/50/-2  Membrane status:PPROM at 0400 Amniotic color: clear   Assessment / Plan: PPROM  Latency Abx given, next Ampicillin due at 1830, GBS negative BMZ given at [address] [place], [facility] declined transfer due to closed NICU, pending call to [facility] able to accept transfer.   [facility], CNM 09/08/2020, 3:53 PM

## 2020-09-08 NOTE — Progress Notes (Addendum)
Labor Progress Note  PEACE NOYES is a 35 y.o. [phone] at [redacted]w[redacted]d by LMP admitted for PPROM  Subjective: comfortable, no pain or bleeding.   Objective: BP (!) 117/58   Pulse 69   Temp 97.9 F (36.6 C) (Oral)   Resp 18   Ht 5\' 5"  (1.651 m)   Wt 97.5 kg   LMP 02/21/2020 (Exact Date)   BMI 35.78 kg/m  Notable VS details: reviewed.   Fetal Assessment: FHT:  FHR: 145 bpm, variability: moderate,  accelerations:  Present,  decelerations:  Present variable and prolonged.  Category/reactivity:  Category II UC:   none SVE:   deferred Membrane status:PPROM at 0400 Amniotic color: clear  Labs: Lab Results  Component Value Date   WBC 9.2 09/08/2020   HGB 10.9 (L) 09/08/2020   HCT 32.0 (L) 09/08/2020   MCV 85.1 09/08/2020   PLT 297 09/08/2020    Assessment / Plan: PPROM  Latency Abx given, next Ampicillin due at 1830, GBS negative BMZ given at 1259 [facility] unable to accept transfer; [facility] transfer [facility] called at 1330, updated at 1410- pending MFM callback.    09/10/2020, CNM 09/08/2020, 2:20 PM  1446: attempted to call [facility], informed by Dr [facility], attending, unable to accept due to closed NICU status.   1505: TC back from [facility],MFM Dr Shawnie Pons, NICU on divert, checking with NICU attending on whether they can accept.

## 2020-09-08 NOTE — Progress Notes (Signed)
Labor Progress Note  Annette Cline is a 35 y.o. [phone] at [redacted]w[redacted]d by LMP admitted for PPROM  Subjective: comfortable, no pain or bleeding. Intermittent pressure and leaking fluid, but no change  Objective: BP 115/80 (BP Location: Right Arm)   Pulse 76   Temp 97.9 F (36.6 C) (Axillary)   Resp 16   Ht 5\' 5"  (1.651 m)   Wt 97.5 kg   LMP 02/21/2020 (Exact Date)   SpO2 100%   BMI 35.78 kg/m  Notable VS details: reviewed.   Fetal Assessment: FHT:   FHR: 145 bpm, variability: moderate,  accelerations:  Present,  decelerations:  Present variable occasionally.  Category/reactivity:  Category II UC:   rare SVE:  repeat now, unchanged. 1/50/-2  Membrane status:PPROM at 0400 Amniotic color: clear   Assessment / Plan: PPROM  Latency Abx given, next Ampicillin due at 1830, GBS negative BMZ given at 1259  Transport team here for pickup.   03-04-1997, CNM 09/08/2020, 8:58 PM

## 2020-09-09 LAB — RPR: RPR Ser Ql: NONREACTIVE

## 2020-09-10 LAB — HEMOGLOBIN A1C
Hgb A1c MFr Bld: 5.2 % (ref 4.8–5.6)
Mean Plasma Glucose: 103 mg/dL

## 2020-09-11 LAB — RUBELLA SCREEN: Rubella: 1.69 index (ref >0.99)

## 2021-05-16 ENCOUNTER — Emergency Department
Admission: EM | Admit: 2021-05-16 | Discharge: 2021-05-16 | Disposition: A | Discharge status: Home / Self Care | Payer: Medicaid Other | Attending: Emergency Medicine | Admitting: Emergency Medicine

## 2021-05-16 ENCOUNTER — Emergency Department: Payer: Medicaid Other

## 2021-05-16 ENCOUNTER — Encounter: Payer: Self-pay | Admitting: *Deleted

## 2021-05-16 ENCOUNTER — Other Ambulatory Visit: Payer: Self-pay

## 2021-05-16 DIAGNOSIS — Z20822 Contact with and (suspected) exposure to covid-19: Secondary | ICD-10-CM | POA: Insufficient documentation

## 2021-05-16 DIAGNOSIS — J988 Other specified respiratory disorders: Secondary | ICD-10-CM | POA: Diagnosis not present

## 2021-05-16 DIAGNOSIS — R059 Cough, unspecified: Secondary | ICD-10-CM | POA: Diagnosis present

## 2021-05-16 DIAGNOSIS — R0781 Pleurodynia: Secondary | ICD-10-CM

## 2021-05-16 DIAGNOSIS — B9789 Other viral agents as the cause of diseases classified elsewhere: Secondary | ICD-10-CM | POA: Diagnosis not present

## 2021-05-16 LAB — RESP PANEL BY RT-PCR (FLU A&B, COVID) ARPGX2
Influenza A by PCR: NEGATIVE
Influenza B by PCR: NEGATIVE
SARS Coronavirus 2 by RT PCR: NEGATIVE

## 2021-05-16 MED ORDER — MELOXICAM 15 MG PO TABS: 15.0000 mg | ORAL_TABLET | Freq: Every day | ORAL | 0 refills | Status: AC

## 2021-05-16 MED ORDER — PSEUDOEPH-BROMPHEN-DM 30-2-10 MG/5ML PO SYRP: 10.0000 mL | ORAL_SOLUTION | Freq: Four times a day (QID) | ORAL | 0 refills | Status: DC | PRN

## 2021-05-16 MED ORDER — PREDNISONE 20 MG PO TABS
60.0000 mg | ORAL_TABLET | Freq: Once | ORAL | Status: AC
  Administered 2021-05-16: 60 mg via ORAL
  Filled 2021-05-16: qty 3

## 2021-05-16 MED ORDER — PREDNISONE 50 MG PO TABS: 50.0000 mg | ORAL_TABLET | Freq: Every day | ORAL | 0 refills | Status: DC

## 2021-05-16 MED ORDER — NAPROXEN 500 MG PO TABS
500.0000 mg | ORAL_TABLET | Freq: Once | ORAL | Status: AC
  Administered 2021-05-16: 500 mg via ORAL
  Filled 2021-05-16: qty 1

## 2021-05-16 MED ORDER — BENZONATATE 100 MG PO CAPS: 100.0000 mg | ORAL_CAPSULE | Freq: Three times a day (TID) | ORAL | 0 refills | Status: AC | PRN

## 2021-05-16 NOTE — ED Provider Notes (Signed)
[facility] Provider Note  Patient Contact: 9:02 PM (approximate)   History   No chief complaint on file.   HPI  Annette Cline is a 36 y.o. female who presents the emergency department complaining of cough times several days as well as rib pain.  Patient was endorsing some cough without fevers or chills.  She does have nasal congestion but has allergies and nasal polyps with chronic congestion.  She states that she was coughing over the last 3 days with no chest pain or shortness of breath and then during a coughing episode today she felt a sharp pain in her left ribs.  No other complaints at this time.  No medications prior to arrival.     Physical Exam   Triage Vital Signs: ED Triage Vitals  Enc Vitals Group     BP 05/16/21 2016 (!) 141/80     Pulse Rate 05/16/21 2016 (!) 113     Resp 05/16/21 2016 (!) 22     Temp 05/16/21 2016 98.3 F (36.8 C)     Temp Source 05/16/21 2016 Oral     SpO2 05/16/21 2016 93 %     Weight 05/16/21 2014 200 lb (90.7 kg)     Height 05/16/21 2014 5\' 5"  (1.651 m)     Head Circumference --      Peak Flow --      Pain Score 05/16/21 2014 10     Pain Loc --      Pain Edu? --      Excl. in GC? --     Most recent vital signs: Vitals:   05/16/21 2016  BP: (!) 141/80  Pulse: (!) 113  Resp: (!) 22  Temp: 98.3 F (36.8 C)  SpO2: 93%     General: Alert and in no acute distress. ENT:      Ears:       Nose: Moderate congestion/rhinnorhea.      Mouth/Throat: Mucous membranes are moist. Neck: No stridor. No cervical spine tenderness to palpation. Hematological/Lymphatic/Immunilogical: No cervical lymphadenopathy. Cardiovascular:  Good peripheral perfusion Respiratory: Normal respiratory effort without tachypnea or retractions. Lungs mild faint crackles in the left lower lung field.  No wheezing, rales, or rhonchi.05/18/21 air entry to the bases with no decreased or absent breath sounds. Musculoskeletal: Full range of  motion to all extremities.  Neurologic:  No gross focal neurologic deficits are appreciated.  Skin:   No rash noted Other:   ED Results / Procedures / Treatments   Labs (all labs ordered are listed, but only abnormal results are displayed) Labs Reviewed  RESP PANEL BY RT-PCR (FLU A&B, COVID) [id number]     EKG     RADIOLOGY  I personally viewed and evaluated these images as part of my medical decision making, as well as reviewing the written report by the radiologist.  ED Provider Interpretation: Chest x-ray with no cardiopulmonary abnormality.  No rib fracture.  DG Chest 2 View  Result Date: 05/16/2021 CLINICAL DATA:  Cough.  Right rib pain. EXAM: CHEST - 2 VIEW COMPARISON:  Chest radiograph dated 09/18/2016. FINDINGS: The heart size and mediastinal contours are within normal limits. Both lungs are clear. The visualized skeletal structures are unremarkable. IMPRESSION: No active cardiopulmonary disease. Electronically Signed   By: 09/20/2016 M.D.   On: 05/16/2021 21:30    PROCEDURES:  Critical Care performed: No  Procedures   MEDICATIONS ORDERED IN ED: Medications  naproxen (NAPROSYN) tablet 500 mg (has no  administration in time range)  predniSONE (DELTASONE) tablet 60 mg (has no administration in time range)     IMPRESSION / MDM / ASSESSMENT AND PLAN / ED COURSE  I reviewed the triage vital signs and the nursing notes.                              Differential diagnosis includes, but is not limited to, viral illness, COVID, flu, rib fracture, pneumothorax, bronchitis, pneumonia   Patient's diagnosis is consistent with viral illness, rib pain.  Patient presented to the emergency department with rib pain after coughing.  Patient has had nasal congestion, cough for the last 3 to 4 days.  She was coughing so hard today that she felt a pop and pain in her ribs.  Overall reassuring exam.  Negative COVID and flu test.  Negative chest x-ray.  Patient be given  prednisone, meloxicam, Tessalon and Bromfed.  Follow-up with primary care as needed.  Return precautions discussed with the patient..  Patient is given ED precautions to return to the ED for any worsening or new symptoms.        FINAL CLINICAL IMPRESSION(S) / ED DIAGNOSES   Final diagnoses:  Viral respiratory illness  Rib pain     Rx / DC Orders   ED Discharge Orders          Ordered    predniSONE (DELTASONE) 50 MG tablet  Daily with breakfast        05/16/21 2158    meloxicam (MOBIC) 15 MG tablet  Daily        05/16/21 2158    benzonatate (TESSALON PERLES) 100 MG capsule  3 times daily PRN        05/16/21 2158    brompheniramine-pseudoephedrine-DM 30-2-10 MG/5ML syrup  4 times daily PRN        05/16/21 2158             Note:  This document was prepared using Dragon voice recognition software and may include unintentional dictation errors.   Lanette Hampshire 05/16/21 2159    Minna Antis, MD 05/16/21 2249

## 2021-05-16 NOTE — ED Triage Notes (Signed)
Pt has a cough and cold sx for 3 days.  Cig smoker.  Fever 2 days ago.  Pt alert  speech clear.

## 2021-05-16 NOTE — ED Notes (Signed)
ED Provider at bedside. 

## 2022-03-12 ENCOUNTER — Other Ambulatory Visit: Payer: Self-pay

## 2022-03-12 ENCOUNTER — Emergency Department
Admission: EM | Admit: 2022-03-12 | Discharge: 2022-03-12 | Disposition: A | Discharge status: Home / Self Care | Payer: Medicaid Other | Attending: Emergency Medicine | Admitting: Emergency Medicine

## 2022-03-12 DIAGNOSIS — H9201 Otalgia, right ear: Secondary | ICD-10-CM | POA: Diagnosis present

## 2022-03-12 DIAGNOSIS — H66002 Acute suppurative otitis media without spontaneous rupture of ear drum, left ear: Secondary | ICD-10-CM | POA: Insufficient documentation

## 2022-03-12 LAB — GROUP A STREP BY PCR: Group A Strep by PCR: NOT DETECTED

## 2022-03-12 MED ORDER — AMOXICILLIN 875 MG PO TABS: 875.0000 mg | ORAL_TABLET | Freq: Two times a day (BID) | ORAL | 0 refills | Status: AC

## 2022-03-12 NOTE — Discharge Instructions (Addendum)
Take Amoxicillin twice daily for ten days.  Take one spray of Flonase each side.

## 2022-03-12 NOTE — ED Provider Triage Note (Signed)
  Emergency Medicine Provider Triage Evaluation Note  Annette Cline , a 36 y.o.female,  was evaluated in triage.  Pt complains of right-sided ear pain.  She states this been going on for the past 2 weeks.  She states that it feels like it is closing up.  Reports some neck pain as well.  No other symptoms.   Review of Systems  Positive: Right-sided ear pain, neck pain. Negative: Denies fever, chest pain, vomiting  Physical Exam   Vitals:   03/12/22 1226 03/12/22 1228  BP: 132/83   Pulse:    Resp:    Temp: 98.1 F (36.7 C)   SpO2:  99%   Gen:   Awake, no distress   Resp:  Normal effort  MSK:   Moves extremities without difficulty  Other:  View of TM obstructed by cerumen.  Medical Decision Making  Given the patient's initial medical screening exam, the following diagnostic evaluation has been ordered. The patient will be placed in the appropriate treatment space, once one is available, to complete the evaluation and treatment. I have discussed the plan of care with the patient and I have advised the patient that an ED physician or mid-level practitioner will reevaluate their condition after the test results have been received, as the results may give them additional insight into the type of treatment they may need.    Diagnostics: None immediately.  Treatments: none immediately   Varney Daily, [place] 03/12/22 1314

## 2022-03-12 NOTE — ED Triage Notes (Signed)
Pt to ED via POV, pt states that she has been having pain in her right ear for about 2 weeks. Pt states that she woke up 2 weeks ago and it felt like her ear was stuck together in the tube. Pt reports pain and decreased hearing since then. Pt denies any drainage. Pt states that she feels like there is a lot of pressure and that it is starting to swell in her neck.

## 2022-03-12 NOTE — ED Provider Notes (Signed)
[facility] Provider Note  Patient Contact: 3:13 PM (approximate)   History   No chief complaint on file.   HPI  Annette Cline is a 36 y.o. female presents to the emergency department with right ear pain and right-sided pharyngitis.  Patient denies anterior cervical lymphadenopathy.  She states that her kids have been sick at home with similar symptoms.  She endorses sporadic cough occasionally.  No nasal congestion or rhinorrhea.      Physical Exam   Triage Vital Signs: ED Triage Vitals  Enc Vitals Group     BP 03/12/22 1226 132/83     Pulse Rate 03/12/22 1224 83     Resp 03/12/22 1224 16     Temp 03/12/22 1226 98.1 F (36.7 C)     Temp src --      SpO2 03/12/22 1228 99 %     Weight --      Height --      Head Circumference --      Peak Flow --      Pain Score 03/12/22 1224 9     Pain Loc --      Pain Edu? --      Excl. in GC? --     Most recent vital signs: Vitals:   03/12/22 1226 03/12/22 1228  BP: 132/83   Pulse:    Resp:    Temp: 98.1 F (36.7 C)   SpO2:  99%     General: Alert and in no acute distress. Eyes:  PERRL. EOMI. Ear: Right TM impacted by cerumen Head: No acute traumatic findings ENT:      Nose: No congestion/rhinnorhea.      Mouth/Throat: Mucous membranes are moist.  Neck: No stridor. No cervical spine tenderness to palpation. Cardiovascular:  Good peripheral perfusion Respiratory: Normal respiratory effort without tachypnea or retractions. Lungs CTAB. Good air entry to the bases with no decreased or absent breath sounds. Gastrointestinal: Bowel sounds 4 quadrants. Soft and nontender to palpation. No guarding or rigidity. No palpable masses. No distention. No CVA tenderness. Musculoskeletal: Full range of motion to all extremities.  Neurologic:  No gross focal neurologic deficits are appreciated.  Skin:   No rash noted    ED Results / Procedures / Treatments   Labs (all labs ordered are listed, but  only abnormal results are displayed) Labs Reviewed  GROUP A STREP BY PCR      PROCEDURES:  Critical Care performed: No  Procedures   MEDICATIONS ORDERED IN ED: Medications - No data to display   IMPRESSION / MDM / ASSESSMENT AND PLAN / ED COURSE  I reviewed the triage vital signs and the nursing notes.                             Assessment and plan:  Ear pain:  Pharyngitis:   36 year old female presents to the emergency department with right ear pain.  Group A strep was negative.  Will treat for otitis media with high-dose amoxicillin twice daily for 10 days as well as daily Flonase.  Return precautions were given to return with new or worsening symptoms.   FINAL CLINICAL IMPRESSION(S) / ED DIAGNOSES   Final diagnoses:  Acute suppurative otitis media of left ear without spontaneous rupture of tympanic membrane, recurrence not specified     Rx / DC Orders   ED Discharge Orders          Ordered  amoxicillin (AMOXIL) 875 MG tablet  2 times daily        03/12/22 1801             Note:  This document was prepared using Dragon voice recognition software and may include unintentional dictation errors.   Pia Mau [place], PA-C 03/12/22 1936    Shaune Pollack, MD 03/16/22 [phone]

## 2022-08-29 ENCOUNTER — Emergency Department: Payer: Medicaid Other

## 2022-08-29 ENCOUNTER — Emergency Department
Admission: EM | Admit: 2022-08-29 | Discharge: 2022-08-29 | Disposition: A | Discharge status: Home / Self Care | Payer: Medicaid Other | Attending: Emergency Medicine | Admitting: Emergency Medicine

## 2022-08-29 DIAGNOSIS — J209 Acute bronchitis, unspecified: Secondary | ICD-10-CM | POA: Diagnosis not present

## 2022-08-29 DIAGNOSIS — R059 Cough, unspecified: Secondary | ICD-10-CM | POA: Diagnosis present

## 2022-08-29 DIAGNOSIS — Z1152 Encounter for screening for COVID-19: Secondary | ICD-10-CM | POA: Diagnosis not present

## 2022-08-29 LAB — POC URINE PREG, ED: Preg Test, Ur: NEGATIVE

## 2022-08-29 LAB — SARS CORONAVIRUS 2 BY RT PCR: SARS Coronavirus 2 by RT PCR: NEGATIVE

## 2022-08-29 MED ORDER — IPRATROPIUM-ALBUTEROL 0.5-2.5 (3) MG/3ML IN SOLN
3.0000 mL | Freq: Once | RESPIRATORY_TRACT | Status: AC
  Administered 2022-08-29: 3 mL via RESPIRATORY_TRACT
  Filled 2022-08-29: qty 3

## 2022-08-29 MED ORDER — PREDNISONE 10 MG PO TABS
50.0000 mg | ORAL_TABLET | Freq: Every day | ORAL | 0 refills | Status: AC
Start: 2022-08-29 — End: ?

## 2022-08-29 MED ORDER — ALBUTEROL SULFATE HFA 108 (90 BASE) MCG/ACT IN AERS: 2.0000 | INHALATION_SPRAY | RESPIRATORY_TRACT | 1 refills | Status: AC | PRN

## 2022-08-29 MED ORDER — PREDNISONE 20 MG PO TABS
60.0000 mg | ORAL_TABLET | Freq: Once | ORAL | Status: AC
  Administered 2022-08-29: 60 mg via ORAL
  Filled 2022-08-29: qty 3

## 2022-08-29 MED ORDER — GUAIFENESIN-CODEINE 100-10 MG/5ML PO SYRP: 5.0000 mL | ORAL_SOLUTION | Freq: Three times a day (TID) | ORAL | 0 refills | Status: DC | PRN

## 2022-08-29 NOTE — ED Triage Notes (Signed)
Pt presents to the ED due to R side pain and chest congestion that started four days ago. Pt states her cough is worse at night. Pt denies NVD. pT a&oX4

## 2022-08-29 NOTE — ED Provider Notes (Signed)
[facility] Provider Note    Event Date/Time   First MD Initiated Contact with Patient 08/29/22 1632     (approximate)   History   Cough   HPI  Annette Cline is a 37 y.o. female without significant past medical history and as listed in EMR presents to the emergency department for treatment and evaluation of cough for the past 4 days. She reports one day of fever at onset of symptoms. Cough is nonproductive. Right side chest wall hurts with movement, palpation, or cough. No alleviating measures prior to arrival.       Physical Exam   Triage Vital Signs: ED Triage Vitals  Enc Vitals Group     BP 08/29/22 1335 132/82     Pulse Rate 08/29/22 1333 93     Resp 08/29/22 1333 17     Temp 08/29/22 1333 98.9 F (37.2 C)     Temp Source 08/29/22 1333 Oral     SpO2 08/29/22 1333 100 %     Weight 08/29/22 1338 240 lb (108.9 kg)     Height --      Head Circumference --      Peak Flow --      Pain Score 08/29/22 1338 9     Pain Loc --      Pain Edu? --      Excl. in GC? --     Most recent vital signs: Vitals:   08/29/22 1700 08/29/22 1825  BP: 111/79 125/83  Pulse: 72 86  Resp:  16  Temp:    SpO2: 100% 98%    General: Awake, no distress.  CV:  Good peripheral perfusion.  Resp:  Normal effort.  Expiratory wheezing noted Abd:  No distention.  Other:  Diminished breath sounds bilateral bases.   ED Results / Procedures / Treatments   Labs (all labs ordered are listed, but only abnormal results are displayed) Labs Reviewed  POC URINE PREG, ED - Normal  SARS CORONAVIRUS 2 BY RT PCR     EKG  Not indicated.   RADIOLOGY  Image and radiology report reviewed and interpreted by me. Radiology report consistent with the same.  X-ray negative for acute cardiopulmonary abnormality.  PROCEDURES:  Critical Care performed: No  Procedures   MEDICATIONS ORDERED IN ED:  Medications  predniSONE (DELTASONE) tablet 60 mg (60 mg Oral Given  08/29/22 1647)  ipratropium-albuterol (DUONEB) 0.5-2.5 (3) MG/3ML nebulizer solution 3 mL (3 mLs Nebulization Given 08/29/22 1647)     IMPRESSION / MDM / ASSESSMENT AND PLAN / ED COURSE   I have reviewed the triage note.  Differential diagnosis includes, but is not limited to, Bronchitis, URI, Covid, influenza  Patient's presentation is most consistent with acute illness / injury with system symptoms.  37 year old female presents to the emergency department for evaluation of cough and chest congestion for the past 4 days.  See HPI for further details.  Chest x-ray is negative.  COVID test is negative as well.  She is having some wheezing on exam.  Plan will be to give her a DuoNeb and prednisone.  Patient reports improvement after DuoNeb.  She will be discharged home with a burst dose of prednisone, albuterol inhaler, and guaifenesin with codeine prescriptions.  She is to follow-up with her primary care provider or return to the emergency department if symptoms change or worsen.      FINAL CLINICAL IMPRESSION(S) / ED DIAGNOSES   Final diagnoses:  Acute bronchitis,  unspecified organism     Rx / DC Orders   ED Discharge Orders          Ordered    predniSONE (DELTASONE) 10 MG tablet  Daily        08/29/22 1758    albuterol (VENTOLIN HFA) 108 (90 Base) MCG/ACT inhaler  Every 4 hours PRN        08/29/22 1758    guaiFENesin-codeine (ROBITUSSIN AC) 100-10 MG/5ML syrup  3 times daily PRN        08/29/22 1758             Note:  This document was prepared using Dragon voice recognition software and may include unintentional dictation errors.   Chinita Pester, FNP 08/29/22 2335    Concha Se, MD 08/30/22 [phone]

## 2024-01-08 ENCOUNTER — Ambulatory Visit (LOCAL_COMMUNITY_HEALTH_CENTER)

## 2024-01-08 VITALS — BP 115/56 | Ht 65.0 in | Wt 243.0 lb

## 2024-01-08 DIAGNOSIS — Z309 Encounter for contraceptive management, unspecified: Secondary | ICD-10-CM

## 2024-01-08 DIAGNOSIS — Z3201 Encounter for pregnancy test, result positive: Secondary | ICD-10-CM | POA: Diagnosis not present

## 2024-01-08 LAB — PREGNANCY, URINE: Preg Test, Ur: POSITIVE — AB

## 2024-01-08 NOTE — Progress Notes (Signed)
 UPT positive. Plans prenatal care at [facility]. Advised to establish care ASAP.   Positive preg packet given and reviewed.   Patient complains of SOB during the day and also while sleeping.   Consult Dr JAYSON Helling who advises patient to seek immediate medical attention/go to ER/urgent care for breathing difficulties.  RN explained this to patient and verbalizes understanding.   The patient was dispensed prenatal vitamins #100 today per SO Dr JAYSON Helling. I provided counseling today regarding the medication. We discussed the medication, the side effects and when to call clinic. Patient given the opportunity to ask questions. Questions answered.    Sent to clerk for presumptive eligibiltiy/medicaid preg women. Uchechukwu Dhawan, RN

## 2024-01-15 ENCOUNTER — Ambulatory Visit: Admitting: Nurse Practitioner

## 2024-01-15 ENCOUNTER — Encounter: Payer: Self-pay | Admitting: Nurse Practitioner

## 2024-01-15 VITALS — BP 108/64 | HR 75 | Temp 98.3°F | Wt 239.8 lb

## 2024-01-15 DIAGNOSIS — O99332 Smoking (tobacco) complicating pregnancy, second trimester: Secondary | ICD-10-CM

## 2024-01-15 DIAGNOSIS — Z9089 Acquired absence of other organs: Secondary | ICD-10-CM | POA: Insufficient documentation

## 2024-01-15 DIAGNOSIS — O0932 Supervision of pregnancy with insufficient antenatal care, second trimester: Secondary | ICD-10-CM | POA: Diagnosis not present

## 2024-01-15 DIAGNOSIS — O09522 Supervision of elderly multigravida, second trimester: Secondary | ICD-10-CM

## 2024-01-15 DIAGNOSIS — O0930 Supervision of pregnancy with insufficient antenatal care, unspecified trimester: Secondary | ICD-10-CM

## 2024-01-15 DIAGNOSIS — O09899 Supervision of other high risk pregnancies, unspecified trimester: Secondary | ICD-10-CM

## 2024-01-15 DIAGNOSIS — Z3A25 25 weeks gestation of pregnancy: Secondary | ICD-10-CM

## 2024-01-15 DIAGNOSIS — O99210 Obesity complicating pregnancy, unspecified trimester: Secondary | ICD-10-CM | POA: Insufficient documentation

## 2024-01-15 DIAGNOSIS — Z3482 Encounter for supervision of other normal pregnancy, second trimester: Secondary | ICD-10-CM

## 2024-01-15 DIAGNOSIS — Z9049 Acquired absence of other specified parts of digestive tract: Secondary | ICD-10-CM

## 2024-01-15 DIAGNOSIS — D069 Carcinoma in situ of cervix, unspecified: Secondary | ICD-10-CM

## 2024-01-15 DIAGNOSIS — Z8742 Personal history of other diseases of the female genital tract: Secondary | ICD-10-CM

## 2024-01-15 DIAGNOSIS — Z3480 Encounter for supervision of other normal pregnancy, unspecified trimester: Secondary | ICD-10-CM | POA: Insufficient documentation

## 2024-01-15 DIAGNOSIS — O99212 Obesity complicating pregnancy, second trimester: Secondary | ICD-10-CM

## 2024-01-15 DIAGNOSIS — O99330 Smoking (tobacco) complicating pregnancy, unspecified trimester: Secondary | ICD-10-CM | POA: Insufficient documentation

## 2024-01-15 DIAGNOSIS — R0600 Dyspnea, unspecified: Secondary | ICD-10-CM

## 2024-01-15 HISTORY — DX: Acquired absence of other organs: Z90.89

## 2024-01-15 LAB — HEMOGLOBIN, FINGERSTICK: Hemoglobin: 11.9 g/dL (ref 11.1–15.9)

## 2024-01-15 MED ORDER — ASPIRIN 81 MG PO TBEC
81.0000 mg | DELAYED_RELEASE_TABLET | Freq: Every day | ORAL | Status: AC
Start: 2024-01-15 — End: ?

## 2024-01-15 NOTE — Progress Notes (Signed)
 [facility] [address], [address] [place] [address] Main phone: [phone]  Initial Prenatal Visit  Subjective:  Annette Cline is a 38 y.o. [name] at [redacted]w[redacted]d being seen today to start prenatal care at the [facility] Department. The following medical issues will be considered in the care of this low-risk pregnancy:   Patient Active Problem List   Diagnosis Date Noted   Hx of tonsillectomy 01/15/2024   History of laparoscopic appendectomy 01/15/2024   Left ovarian cyst 08/02/2018   CIN III (cervical intraepithelial neoplasia grade III) with severe dysplasia 08/02/2018   Patient reports no bleeding, no contractions, no cramping, no leaking, and shortness of breath when she is walking.  Pt also reports she has bumps every time she has sex with her partner that comes and goes but doesn't cause any pain. She states she uses yoni bars in her vaginal area.  Denies leaking of fluid. Pt denies any other vaginal symptoms at visit today.   Indications for ASA therapy One of the following: Previous pregnancy with preeclampsia, especially early onset and with an adverse outcome No  Multifetal gestation No  Chronic hypertension No  Type 1 or 2 diabetes mellitus No  Chronic kidney disease No  Autoimmune disease (antiphospholipid syndrome, systemic lupus erythematosus) No   Two or more of the following: Nulliparity  No  Obesity (body mass index >30 kg/m2) Yes  Family history of preeclampsia in mother or sister No  Age >=35 years Yes  Sociodemographic characteristics (African American race, low socioeconomic level) Yes  Personal risk factors (eg, previous pregnancy with low birth weight or small for gestational age infant, previous adverse pregnancy outcome [eg, stillbirth], interval >10 years between pregnancies) No   The following portions of the patient's history were reviewed and updated as appropriate: allergies,  current medications, past family history, past medical history, past social history, past surgical history and problem list. Problem list updated.  Objective:   Vitals:   01/15/24 0855  BP: 108/64  Pulse: 75  Temp: 98.3 F (36.8 C)  Weight: 239 lb 12.8 oz (108.8 kg)   Fetal Status: Fetal Heart Rate (bpm): 150 Fundal Height: 28 cm Movement: Present     Physical Exam Vitals and nursing note reviewed. Exam conducted with a chaperone present Francie Bolognese, NP).  Constitutional:      General: She is not in acute distress.    Appearance: Normal appearance. She is well-developed. She is obese.  HENT:     Head: Normocephalic and atraumatic.     Nose: Nose normal. No congestion or rhinorrhea.     Mouth/Throat:     Lips: Pink.     Mouth: Mucous membranes are moist.     Dentition: Normal dentition. No dental caries.     Pharynx: Oropharynx is clear. Uvula midline.     Comments: Dentition: no caries noted, no missing teeth. Discolored from tobacco use Eyes:     General: No scleral icterus.    Conjunctiva/sclera: Conjunctivae normal.  Neck:     Thyroid: No thyroid mass or thyromegaly.  Cardiovascular:     Rate and Rhythm: Normal rate and regular rhythm.     Pulses: Normal pulses.     Heart sounds: Normal heart sounds.     Comments: Extremities are warm and well perfused Pulmonary:     Effort: Pulmonary effort is normal.     Breath sounds: Examination of the right-lower field reveals decreased breath sounds. Examination of the  left-lower field reveals decreased breath sounds. Decreased breath sounds present. No wheezing.  Chest:     Chest wall: No mass.  Abdominal:     General: Abdomen is flat.     Palpations: Abdomen is soft.     Tenderness: There is no abdominal tenderness.     Comments: Gravid   Genitourinary:    General: Normal vulva.     Exam position: Lithotomy position.     Pubic Area: No rash.      Labia:        Right: No rash or tenderness.        Left: No rash or  tenderness.      Vagina: No vaginal discharge.     Uterus: Normal. Enlarged (Gravid ~28cm).       Comments: Several small (less than 1 cm) circular, flesh-color skin tags/warts noted around vulva at 7 and 5o'clock and a pedunculated, pink, dime-size skin tag noted on the dorsal side of the right thigh.  Musculoskeletal:     Right lower leg: No edema.     Left lower leg: No edema.  Lymphadenopathy:     Cervical: No cervical adenopathy.  Skin:    General: Skin is warm.     Capillary Refill: Capillary refill takes less than 2 seconds.  Neurological:     Mental Status: She is alert.  Psychiatric:        Mood and Affect: Mood normal.        Behavior: Behavior normal. Behavior is cooperative.    Assessment and Plan:  Pregnancy: [name] at [redacted]w[redacted]d  1. [redacted] weeks gestation of pregnancy (Primary) RV in 3 weeks.   2. Encounter for supervision of normal intrauterine pregnancy in multigravida, antepartum  Pt continues to take PNV without concerns.  TWG: -3.2 oz (-0.091 kg) . BP: 108/64 WNL. Pt declined CBE exam today, pt has no concerns with breast and has breastfeed previous children. Pt denies nausea or vomiting. Routine pap was collected at visit today (01/15/24), will notify pt of any abnormal results.  Pt last dental visit has been some years ago at [facility]. Encouraged to seek dental care this pregnancy.  -Referral for Anatomy US  placed 01/15/24 to [facility].   -EPDS:0   - Pregnancy, Initial Screen - Lead, blood (adult age 61 yrs or greater) - MaterniT21  plus Core+ESS+SCA, Blood - US  OB Comp + 14 Wk; Future  3. Late prenatal care affecting pregnancy, antepartum  4. AMA (advanced maternal age) multigravida 35+, second trimester  -ASA indicated and given in clinic .  5. Tobacco use during pregnancy, antepartum Pt is not ready to quit. Pt smokes 15 cigarettes a day  and has been smoking since she was 13. 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 Offered quitline to pt,  pt took information.  Reviewed smoking cessation information sheet as a resource for pt to utilize.   6. Obesity in pregnancy Pregravid BMI=39 Obesity labs collected at visit today.   Reviewed total weight gain in pregnancy with pt of ~11-20 lbs. Provided educational guidance pertaining to nutrition increase in food high in protein,  fruits, vegetables. Informed pt that 30 minutes of daily exercise in pregnancy is recommenced.   -Offered MNT referral, pt politely declined.   - Hgb Fractionation Cascade - Comprehensive metabolic panel with GFR - Hgb J8r w/o eAG - Protein / creatinine ratio, urine - TSH - Glucose, 1 hour - aspirin EC 81 MG tablet; Take 1 tablet (81 mg total) by mouth daily.  Swallow whole. - Hemoglobin, venipuncture  7. Hx of tonsillectomy Pt states this procedure was conducted when she was ~ 38 years old.   8. History of laparoscopic appendectomy Laparoscopic Appendectomy -  09/19/16  9. Hx of abnormal cervical Pap smear -Conization Cervix with Biopsy on 04/17/2015  -Last pap 09/08/2018, NILM, HPV Negative - IGP, Aptima HPV  10. Hx of preterm delivery, currently pregnant -Preterm delivery at 30 weeks (09/18/2020) -Preterm delivery at  36 weeks 2 d (02/06/2016) -Preterm delivery at 36 weeks (03/12/2011)  11. Dyspnea, unspecified type Pt has an albuterol  inhaler that she utilizes frequently. Discussed with pt referral to pulmonologist for further evaluation of persistent dyspnea requiring frequent use of albuterol  inhaler.   -Pulmonology referral placed to [facility] on 01/15/24.  Discussed overview of care and coordination with inpatient delivery practices including [facility],  [facility].   Reviewed Centering pregnancy as standard of care at [facility], oriented to room and showed video. Based on EDD, plan for Cycle 7 .   Preterm labor symptoms and general obstetric precautions including but not limited to vaginal bleeding,  contractions, leaking of fluid and fetal movement were reviewed in detail with the patient.  Please refer to After Visit Summary for other counseling recommendations.   Return in about 2 weeks (around 01/29/2024) for Routine PNC.  Future Appointments  Date Time Provider Department Center  01/22/2024 11:00 AM [facility]-US  [facility]-US  [facility]-Outpati  01/29/2024 10:00 AM AC-MH PROVIDER AC-MAT None    Alexia Trudy ANGER, MPH  Attestation of Supervision of Advanced Practitioner (CNM/PA/NP): Evaluation and management procedures were performed by the Advanced Practice Provider under my supervision and collaboration.  I have reviewed the Advanced Practice Provider's note and chart, and I agree with the management and plan. I have also made any necessary editorial changes.   I was working along side this practitioner all day and all medical plans were discussed with me.   Caine Barfield K Annica Marinello, NP

## 2024-01-15 NOTE — Progress Notes (Addendum)
 Here for new OB visit at about 25 4/7. Has inhaler, and states has some shortness of breath. Lost her job and trying to find another one. Decline flu vaccine and Inheritest. .Izetta Parish, RN   Hgb= 11.9 today.Toured Centering room and plans to go to [facility] next time.SABRASABRAIzetta Parish, RN

## 2024-01-16 LAB — PROTEIN / CREATININE RATIO, URINE
Creatinine, Urine: 148.2 mg/dL
Protein, Ur: 21.3 mg/dL
Protein/Creat Ratio: 144 mg/g{creat} (ref 0–200)

## 2024-01-18 ENCOUNTER — Ambulatory Visit: Payer: Self-pay | Admitting: Family Medicine

## 2024-01-18 DIAGNOSIS — O99210 Obesity complicating pregnancy, unspecified trimester: Secondary | ICD-10-CM

## 2024-01-18 DIAGNOSIS — O99330 Smoking (tobacco) complicating pregnancy, unspecified trimester: Secondary | ICD-10-CM

## 2024-01-18 DIAGNOSIS — O09523 Supervision of elderly multigravida, third trimester: Secondary | ICD-10-CM

## 2024-01-18 DIAGNOSIS — Z3480 Encounter for supervision of other normal pregnancy, unspecified trimester: Secondary | ICD-10-CM

## 2024-01-18 DIAGNOSIS — O09899 Supervision of other high risk pregnancies, unspecified trimester: Secondary | ICD-10-CM

## 2024-01-18 NOTE — Progress Notes (Signed)
 Reviewed - wnl.   Dorothyann Helling, MD 01/18/24  1:06 PM

## 2024-01-19 LAB — IGP, APTIMA HPV
HPV Aptima: POSITIVE — AB
PAP Smear Comment: 0

## 2024-01-20 NOTE — Progress Notes (Signed)
 PAP smear reviewed, result: NILM (normal), HPV positive. Based on this result and patient's prior cervical cancer screenings, ASCCP currently recommends repeat PAP smear in 1 year .  Dorothyann Helling, MD 01/20/24  12:43 PM

## 2024-01-22 ENCOUNTER — Ambulatory Visit
Admission: RE | Admit: 2024-01-22 | Discharge: 2024-01-22 | Disposition: A | Discharge status: Home / Self Care | Source: Ambulatory Visit | Attending: Nurse Practitioner | Admitting: Nurse Practitioner

## 2024-01-22 DIAGNOSIS — Z3689 Encounter for other specified antenatal screening: Secondary | ICD-10-CM | POA: Insufficient documentation

## 2024-01-22 DIAGNOSIS — Z3A21 21 weeks gestation of pregnancy: Secondary | ICD-10-CM | POA: Diagnosis not present

## 2024-01-22 DIAGNOSIS — Z3480 Encounter for supervision of other normal pregnancy, unspecified trimester: Secondary | ICD-10-CM | POA: Diagnosis present

## 2024-01-22 LAB — PREGNANCY, INITIAL SCREEN
Antibody Screen: NEGATIVE
Basophils Absolute: 0 x10E3/uL (ref 0.0–0.2)
Basos: 0 %
Bilirubin, UA: NEGATIVE
Chlamydia trachomatis, NAA: NEGATIVE
EOS (ABSOLUTE): 0.2 x10E3/uL (ref 0.0–0.4)
Eos: 2 %
Glucose, UA: NEGATIVE
HCV Ab: NONREACTIVE
HIV Screen 4th Generation wRfx: NONREACTIVE
Hematocrit: 36.8 % (ref 34.0–46.6)
Hemoglobin: 11.8 g/dL (ref 11.1–15.9)
Hepatitis B Surface Ag: NEGATIVE
Immature Grans (Abs): 0.1 x10E3/uL (ref 0.0–0.1)
Immature Granulocytes: 1 %
Ketones, UA: NEGATIVE
Leukocytes,UA: NEGATIVE
Lymphocytes Absolute: 3.4 x10E3/uL — ABNORMAL HIGH (ref 0.7–3.1)
Lymphs: 33 %
MCH: 27.7 pg (ref 26.6–33.0)
MCHC: 32.1 g/dL (ref 31.5–35.7)
MCV: 86 fL (ref 79–97)
Monocytes Absolute: 0.7 x10E3/uL (ref 0.1–0.9)
Monocytes: 7 %
Neisseria Gonorrhoeae by PCR: NEGATIVE
Neutrophils Absolute: 6 x10E3/uL (ref 1.4–7.0)
Neutrophils: 57 %
Nitrite, UA: NEGATIVE
Platelets: 295 x10E3/uL (ref 150–450)
Protein,UA: NEGATIVE
RBC, UA: NEGATIVE
RBC: 4.26 x10E6/uL (ref 3.77–5.28)
RDW: 12.6 % (ref 11.7–15.4)
RPR Ser Ql: NONREACTIVE
Rh Factor: POSITIVE
Rubella Antibodies, IGG: 2.23 {index} (ref >0.99)
Specific Gravity, UA: 1.017 (ref 1.005–1.030)
Urobilinogen, Ur: 0.2 mg/dL (ref 0.2–1.0)
WBC: 10.3 x10E3/uL (ref 3.4–10.8)
pH, UA: 6.5 (ref 5.0–7.5)

## 2024-01-22 LAB — MATERNIT21  PLUS CORE+ESS+SCA, BLOOD
11q23 deletion (Jacobsen): NOT DETECTED
15q11 deletion (PW Angelman): NOT DETECTED
1p36 deletion syndrome: NOT DETECTED
22q11 deletion (DiGeorge): NOT DETECTED
4p16 deletion(Wolf-Hirschhorn): NOT DETECTED
5p15 deletion (Cri-du-chat): NOT DETECTED
8q24 deletion (Langer-Giedion): NOT DETECTED
Fetal Fraction: 19
Monosomy X (Turner Syndrome): NOT DETECTED
Result (T21): NEGATIVE
Trisomy 13 (Patau syndrome): NEGATIVE
Trisomy 16: NOT DETECTED
Trisomy 18 (Edwards syndrome): NEGATIVE
Trisomy 21 (Down syndrome): NEGATIVE
Trisomy 22: NOT DETECTED
XXX (Triple X Syndrome): NOT DETECTED
XXY (Klinefelter Syndrome): NOT DETECTED
XYY (Jacobs Syndrome): NOT DETECTED

## 2024-01-22 LAB — COMPREHENSIVE METABOLIC PANEL WITH GFR
ALT: 17 IU/L (ref 0–32)
AST: 14 IU/L (ref 0–40)
Albumin: 4.1 g/dL (ref 3.9–4.9)
Alkaline Phosphatase: 89 IU/L (ref 41–116)
BUN/Creatinine Ratio: 8 — ABNORMAL LOW (ref 9–23)
BUN: 5 mg/dL — ABNORMAL LOW (ref 6–20)
Bilirubin Total: 0.2 mg/dL (ref 0.0–1.2)
CO2: 21 mmol/L (ref 20–29)
Calcium: 9.2 mg/dL (ref 8.7–10.2)
Chloride: 102 mmol/L (ref 96–106)
Creatinine, Ser: 0.64 mg/dL (ref 0.57–1.00)
Globulin, Total: 2.2 g/dL (ref 1.5–4.5)
Glucose: 73 mg/dL (ref 70–99)
Potassium: 4 mmol/L (ref 3.5–5.2)
Sodium: 137 mmol/L (ref 134–144)
Total Protein: 6.3 g/dL (ref 6.0–8.5)
eGFR: 116 mL/min/1.73 (ref >59)

## 2024-01-22 LAB — TSH: TSH: 1.86 u[IU]/mL (ref 0.450–4.500)

## 2024-01-22 LAB — HCV INTERPRETATION

## 2024-01-22 LAB — MICROSCOPIC EXAMINATION
Casts: NONE SEEN /LPF
RBC, Urine: NONE SEEN /HPF (ref 0–2)

## 2024-01-22 LAB — HGB FRACTIONATION CASCADE
Hgb A2: 2.7 % (ref 1.8–3.2)
Hgb A: 97 % (ref 96.4–98.8)
Hgb F: 0.3 % (ref 0.0–2.0)
Hgb S: 0 %

## 2024-01-22 LAB — LEAD, BLOOD (ADULT >= 16 YRS): Lead-Whole Blood: <1 ug/dL (ref 0.0–3.4)

## 2024-01-22 LAB — GLUCOSE, 1 HOUR GESTATIONAL: Gestational Diabetes Screen: 73 mg/dL (ref 70–139)

## 2024-01-22 LAB — HGB A1C W/O EAG: Hgb A1c MFr Bld: 5.4 % (ref 4.8–5.6)

## 2024-01-22 LAB — URINE CULTURE, OB REFLEX

## 2024-01-26 ENCOUNTER — Encounter: Payer: Self-pay | Admitting: Family Medicine

## 2024-01-26 DIAGNOSIS — O09523 Supervision of elderly multigravida, third trimester: Secondary | ICD-10-CM | POA: Insufficient documentation

## 2024-01-26 NOTE — Progress Notes (Signed)
 Reviewed initial OB labs. All wnl or clinically insignificant. Chart updated.  Anatomy US  10/31 shows [redacted]w[redacted]d ([redacted]w[redacted]d difference from LMP). Will use US  for dating.  Needs follow up US  to complete anatomy survey.   Referrals placed in this encounter:  - OB US  follow up - MFM (AMA (58yr), obese (pre-gravid BMI 39) with history preterm delivery x3 (36w0, [redacted]w[redacted]d, and [redacted]w[redacted]d)  - MFM genetics   Dorothyann Helling, MD 01/26/24  12:52 PM

## 2024-01-26 NOTE — Progress Notes (Signed)
 Reviewed. See other result note.   Dorothyann Helling, MD 01/26/24  1:10 PM

## 2024-01-27 ENCOUNTER — Telehealth: Payer: Self-pay

## 2024-01-28 NOTE — Addendum Note (Signed)
 Addended by: Nilan Iddings on: 01/28/2024 12:45 PM   Modules accepted: Orders

## 2024-01-29 ENCOUNTER — Ambulatory Visit

## 2024-01-29 ENCOUNTER — Other Ambulatory Visit: Payer: Self-pay | Admitting: Family Medicine

## 2024-01-29 DIAGNOSIS — Z3480 Encounter for supervision of other normal pregnancy, unspecified trimester: Secondary | ICD-10-CM

## 2024-01-29 DIAGNOSIS — O09523 Supervision of elderly multigravida, third trimester: Secondary | ICD-10-CM

## 2024-01-29 DIAGNOSIS — O99330 Smoking (tobacco) complicating pregnancy, unspecified trimester: Secondary | ICD-10-CM

## 2024-01-29 DIAGNOSIS — O99210 Obesity complicating pregnancy, unspecified trimester: Secondary | ICD-10-CM

## 2024-01-29 DIAGNOSIS — O09899 Supervision of other high risk pregnancies, unspecified trimester: Secondary | ICD-10-CM

## 2024-01-29 NOTE — Progress Notes (Signed)
 Message from MFM:   Hello,   This patient will need an MFM Detail order placed in order to schedule the MFM Consultation.   Thank you.  Encounter for supervision of normal intrauterine pregnancy in multigravida, antepartum -     US  MFM OB DETAIL +14 WK; Future  Tobacco use during pregnancy, antepartum -     US  MFM OB DETAIL +14 WK; Future  Obesity in pregnancy Pregravid BMI=39 -     US  MFM OB DETAIL +14 WK; Future  Hx of preterm delivery, currently pregnant -     US  MFM OB DETAIL +14 WK; Future  AMA (38 at delivery) -     US  MFM OB DETAIL +14 WK; Future   Dorothyann Helling, MD 01/29/24  6:20 PM

## 2024-02-01 NOTE — Telephone Encounter (Signed)
 Patient called to notify of f/u U/S at [facility] on Monday 02/08/2024. Instructions given and patient verbalized understanding. BTHIELE RN

## 2024-02-03 ENCOUNTER — Ambulatory Visit: Attending: Obstetrics and Gynecology

## 2024-02-05 ENCOUNTER — Ambulatory Visit

## 2024-02-05 ENCOUNTER — Telehealth: Payer: Self-pay

## 2024-02-05 NOTE — Telephone Encounter (Signed)
 Rescheduled [facility] visit for next week, missed 11/07 appointment. Patient states she missed genetic counseling visit as well. Given phone number to call: [phone]. BTHIELE RN

## 2024-02-08 ENCOUNTER — Ambulatory Visit: Payer: Self-pay | Admitting: Family Medicine

## 2024-02-08 ENCOUNTER — Ambulatory Visit
Admission: RE | Admit: 2024-02-08 | Discharge: 2024-02-08 | Disposition: A | Discharge status: Home / Self Care | Source: Ambulatory Visit | Attending: Family Medicine | Admitting: Family Medicine

## 2024-02-08 DIAGNOSIS — Z3A24 24 weeks gestation of pregnancy: Secondary | ICD-10-CM | POA: Insufficient documentation

## 2024-02-08 DIAGNOSIS — Z3482 Encounter for supervision of other normal pregnancy, second trimester: Secondary | ICD-10-CM | POA: Diagnosis not present

## 2024-02-08 DIAGNOSIS — Z3480 Encounter for supervision of other normal pregnancy, unspecified trimester: Secondary | ICD-10-CM | POA: Diagnosis present

## 2024-02-08 NOTE — Progress Notes (Signed)
 Growth US  reviewed. EGA consistent with assigned. EFW 57%ile. Normal anatomy, this completes the anatomic survey. Chart updated. To be discussed with patient at next prenatal appointment.   Dorothyann Helling, MD 02/08/24  4:38 PM

## 2024-02-10 ENCOUNTER — Ambulatory Visit

## 2024-02-12 ENCOUNTER — Telehealth: Payer: Self-pay

## 2024-02-12 ENCOUNTER — Ambulatory Visit

## 2024-02-12 NOTE — Telephone Encounter (Signed)
 DNKA 02/12/24 for [facility] RV. Call to client and left message requesting she reschedule [facility] appt and number to call provided. Burnadette Lowers, RN

## 2024-02-22 NOTE — Telephone Encounter (Signed)
 Client now scheduled for 03/01/24.SABRAIzetta Parish, RN

## 2024-02-24 ENCOUNTER — Ambulatory Visit

## 2024-02-25 ENCOUNTER — Encounter: Payer: Self-pay | Admitting: Internal Medicine

## 2024-02-25 ENCOUNTER — Ambulatory Visit: Admitting: Internal Medicine

## 2024-02-25 VITALS — BP 130/80 | HR 74 | Temp 98.7°F | Ht 65.0 in | Wt 233.6 lb

## 2024-02-25 DIAGNOSIS — Z3A28 28 weeks gestation of pregnancy: Secondary | ICD-10-CM

## 2024-02-25 DIAGNOSIS — G4733 Obstructive sleep apnea (adult) (pediatric): Secondary | ICD-10-CM | POA: Diagnosis not present

## 2024-02-25 DIAGNOSIS — O99333 Smoking (tobacco) complicating pregnancy, third trimester: Secondary | ICD-10-CM | POA: Diagnosis not present

## 2024-02-25 DIAGNOSIS — J4541 Moderate persistent asthma with (acute) exacerbation: Secondary | ICD-10-CM

## 2024-02-25 DIAGNOSIS — F17210 Nicotine dependence, cigarettes, uncomplicated: Secondary | ICD-10-CM | POA: Diagnosis not present

## 2024-02-25 DIAGNOSIS — R0600 Dyspnea, unspecified: Secondary | ICD-10-CM

## 2024-02-25 DIAGNOSIS — O99330 Smoking (tobacco) complicating pregnancy, unspecified trimester: Secondary | ICD-10-CM

## 2024-02-25 MED ORDER — BUDESONIDE-FORMOTEROL FUMARATE 160-4.5 MCG/ACT IN AERO: 2.0000 | INHALATION_SPRAY | Freq: Two times a day (BID) | RESPIRATORY_TRACT | 12 refills | Status: DC

## 2024-02-25 NOTE — Progress Notes (Signed)
 [facility] Pulmonary Medicine Consultation      Date: 02/25/2024,   MRN# [id number] NEVADA KIRCHNER 15-Feb-1986    CHIEF COMPLAINT:   Assessment shortness of breath Assessment of wheezing in the setting of tobacco abuse Active pregnancy   HISTORY OF PRESENT ILLNESS   38 year old African-American female seen today for signs symptoms of shortness of breath cough congestion wheezing likely related to underlying reactive airways disease in the setting of tobacco abuse  Actively smoking since age 69 Patient is currently [redacted] weeks pregnant Patient was seen by primary care physician with chest congestion cough and wheezing and was prescribed prednisone  and antibiotic therapy which she has not taken  At this time on physical exam patient is wheezing bilaterally Patient is not in any distress    Patient is seen today for problems and issues with sleep related to excessive daytime sleepiness Patient  has been having sleep problems for many years Patient has been having excessive daytime sleepiness for a long time Patient has been having extreme fatigue and tiredness, lack of energy +  very Loud snoring every night + struggling breathe at night and gasps for air Patient with signs symptoms of obstructive sleep apnea Recommend home sleep test    PAST MEDICAL HISTORY   Past Medical History:  Diagnosis Date  . 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 Acute appendicitis   . Carpal tunnel syndrome   . Dyspnea   . Hx of tonsillectomy 01/15/2024  . No prenatal care in current pregnancy in third trimester 09/08/2020  . Preterm labor      SURGICAL HISTORY   Past Surgical History:  Procedure Laterality Date  . CERVICAL CONIZATION W/BX N/A 04/17/2015   Procedure: CONIZATION CERVIX WITH BIOPSY;  Surgeon: Mitzie BROCKS Ward, MD;  Location: [facility] ORS;  Service: Gynecology;  Laterality: N/A;  . LAPAROSCOPIC APPENDECTOMY N/A 09/19/2016   Procedure: APPENDECTOMY  LAPAROSCOPIC;  Surgeon: Desiderio Schanz, MD;  Location: [facility] ORS;  Service: General;  Laterality: N/A;  . TONSILLECTOMY       FAMILY HISTORY   Family History  Problem Relation Age of Onset  . Kidney disease Mother   . Diabetes Mother   . Scoliosis Brother   . Cancer Maternal Aunt   . Cancer Maternal Grandmother      SOCIAL HISTORY   Social History   Tobacco Use  . Smoking status: Every Day    Current packs/day: 0.50    Types: Cigarettes    Passive exposure: Never  . Smokeless tobacco: Never  . Tobacco comments:    01/08/2024 smokes 15 cig/day / counseled.   Vaping Use  . Vaping status: Never Used  Substance Use Topics  . Alcohol use: Not Currently    Comment: April, 2025  . Drug use: No     MEDICATIONS    Home Medication:  Current Outpatient Rx  . Order #: [id number] Class: Historical Med  . Order #: [id number] Class: Normal  . Order #: [id number] Class: Sample  . Order #: [id number] Class: Normal  . Order #: [id number] Class: Normal  . Order #: [id number] Class: Historical Med  . Order #: [id number] Class: Sample    Current Medication:  Current Outpatient Medications:  .  amoxicillin -clavulanate (AUGMENTIN) 875-125 MG tablet, Take 1 tablet by mouth 2 (two) times daily., Disp: , Rfl:  .  albuterol  (VENTOLIN  HFA) 108 (90 Base) MCG/ACT inhaler, Inhale 2 puffs into the lungs every 4 (four) hours as needed for wheezing or shortness of breath.,  Disp: 1 each, Rfl: 1 .  aspirin  EC 81 MG tablet, Take 1 tablet (81 mg total) by mouth daily. Swallow whole., Disp: , Rfl:  .  guaiFENesin -codeine  (ROBITUSSIN AC) 100-10 MG/5ML syrup, Take 5 mLs by mouth 3 (three) times daily as needed for cough. (Patient not taking: Reported on 01/15/2024), Disp: 120 mL, Rfl: 0 .  predniSONE  (DELTASONE ) 10 MG tablet, Take 5 tablets (50 mg total) by mouth daily. (Patient not taking: Reported on 02/25/2024), Disp: 25 tablet, Rfl: 0 .  Prenatal Vit-Fe Fumarate-FA (MULTIVITAMIN-PRENATAL) 27-0.8 MG TABS tablet,  Take 1 tablet by mouth daily at 12 noon. (Patient not taking: Reported on 02/25/2024), Disp: , Rfl:  .  Prenatal Vit-Fe Fumarate-FA (MULTIVITAMIN-PRENATAL) 27-0.8 MG TABS tablet, Take 1 tablet by mouth daily at 12 noon., Disp: , Rfl:     ALLERGIES   Ciprofloxacin and Sulfa antibiotics  BP 130/80   Pulse 74   Temp 98.7 F (37.1 C)   Ht 5' 5 (1.651 m)   Wt 233 lb 9.6 oz (106 kg)   LMP 07/20/2023 (Exact Date)   SpO2 99%   BMI 38.87 kg/m    Review of Systems: + Shortness of breath + wheezing + cough Other:  All other systems negative   Physical Examination:   General Appearance: No distress  EYES PERRLA, EOM intact.   NECK Supple, No JVD Pulmonary: normal breath sounds, +wheezing.  CardiovascularNormal S1,S2.  No m/r/g.   Neurology UE/LE 5/5 strength, no focal deficits Ext pulses intact, cap refill intact ALL OTHER ROS ARE NEGATIVE      IMAGING    US  OB Follow Up Result Date: 02/08/2024 CLINICAL DATA:  Completion of anatomy survey EXAM: OBSTETRIC 14+ WK ULTRASOUND FOLLOW-UP FINDINGS: Number of Fetuses: 1 Heart Rate:  150 bpm Movement: Yes Presentation: Cephalic Previa: No Placental Location: Fundal Amniotic Fluid (Subjective): Normal Amniotic Fluid (Objective): AFI 13.2 cm FETAL BIOMETRY BPD: 5.96cm 24w 2d HC: 22.27cm  24w   2d AC: 19.63cm  24w   2d FL: 4.48cm  24w   5d Current Mean GA: 24w 1d US  EDC: 05/29/2024 Assigned GA: 24w 1d Assigned EDC: 05/29/2024 Estimated Fetal Weight:  701.2g 57.2%ile FETAL ANATOMY Lateral Ventricles: Previously seen Thalami/CSP: Previously seen Posterior Fossa: Previously seen Nuchal Region: Not visualized Upper Lip: Previously seen Spine: Appears normal 4 Chamber Heart on Left: Previously seen LVOT: Previously seen RVOT: Previously seen Stomach on Left: Previously seen 3 Vessel Cord: Appears normal Cord Insertion site: Appears normal Kidneys: Appears normal Bladder: Appears normal Extremities: Previously seen Sex: Previously Seen Technical  Limitations: None Maternal Findings: Cervix:  Closed, 4.0 cm IMPRESSION: 1. Single live intrauterine pregnancy. Assigned gestational age of [redacted] weeks 1 day. Estimated fetal weight 57th %ile. 2. The fetal spine appears normal. Electronically Signed   By: Limin  Xu M.D.   On: 02/08/2024 12:32      ASSESSMENT/PLAN    38 year old African-American female with ongoing symptoms of cough congestion wheezing consistent with reactive airways disease and asthma in the setting of pregnancy with active tobacco abuse  Highly recommend stop smoking Will start Symbicort inhaler Patient was prescribed prednisone  and antibiotics with Augmentin Continue as prescribed  Smoking Assessment and Cessation Counseling Upon further questioning, Patient smokes 1 ppd I have advised patient to quit/stop smoking as soon as possible due to high risk for multiple medical problems Patient is NOT willing to quit smoking I have advised patient that we can assist and have options of Nicotine  replacement therapy. I also advised patient on  behavioral therapy and can provide oral medication therapy in conjunction with the other therapies Follow up next Office visit  for assessment of smoking cessation Smoking cessation counseling advised for 14 minutes    MEDICATION ADJUSTMENTS/LABS AND TESTS ORDERED: HST Symbicort Smoking cessation Albuterol  as needed   CURRENT MEDICATIONS REVIEWED AT LENGTH WITH PATIENT TODAY   Patient  satisfied with Plan of action and management. All questions answered   Follow up 3 months   I spent a total of 63 minutes dedicated to the care of this patient on the date of this encounter to include pre-visit review of records, face-to-face time with the patient discussing conditions above, post visit ordering of testing, clinical documentation with the electronic health record, making appropriate referrals as documented, and communicating necessary information to the patient's healthcare team.     The Patient requires high complexity decision making for assessment and support, frequent evaluation and titration of therapies, application of advanced monitoring technologies and extensive interpretation of multiple databases.  Patient satisfied with Plan of action and management. All questions answered    Nickolas [name] Cellar, M.D.  [facility] Pulmonary & Critical Care Medicine  Medical Director [facility]

## 2024-02-25 NOTE — Patient Instructions (Addendum)
 Please stop smoking!!  Avoid Allergens and Irritants Avoid secondhand smoke Avoid SICK contacts Recommend  Masking  when appropriate Recommend Keep up-to-date with vaccinations  Start Symbicort 2 puffs in the morning 2 puffs at night Rinse mouth after every use Continue to use albuterol  as needed  Obtain home sleep test to assess for sleep apnea

## 2024-03-01 ENCOUNTER — Ambulatory Visit

## 2024-03-01 VITALS — BP 121/72 | HR 78 | Temp 98.0°F | Wt 233.2 lb

## 2024-03-01 DIAGNOSIS — O99330 Smoking (tobacco) complicating pregnancy, unspecified trimester: Secondary | ICD-10-CM

## 2024-03-01 DIAGNOSIS — Z3A32 32 weeks gestation of pregnancy: Secondary | ICD-10-CM

## 2024-03-01 DIAGNOSIS — O0933 Supervision of pregnancy with insufficient antenatal care, third trimester: Secondary | ICD-10-CM | POA: Insufficient documentation

## 2024-03-01 DIAGNOSIS — O09523 Supervision of elderly multigravida, third trimester: Secondary | ICD-10-CM

## 2024-03-01 DIAGNOSIS — F410 Panic disorder [episodic paroxysmal anxiety] without agoraphobia: Secondary | ICD-10-CM

## 2024-03-01 DIAGNOSIS — O09899 Supervision of other high risk pregnancies, unspecified trimester: Secondary | ICD-10-CM

## 2024-03-01 DIAGNOSIS — Z3480 Encounter for supervision of other normal pregnancy, unspecified trimester: Secondary | ICD-10-CM

## 2024-03-01 DIAGNOSIS — O99210 Obesity complicating pregnancy, unspecified trimester: Secondary | ICD-10-CM

## 2024-03-01 LAB — HEMOGLOBIN, FINGERSTICK: Hemoglobin: 11.7 g/dL (ref 11.1–15.9)

## 2024-03-01 NOTE — Progress Notes (Signed)
 Declined TDAP vaccine, VIS given. Patient signed BTL Consent. CCNC Forms completed. In house hgb reviewed during visit. BTHIELE RN

## 2024-03-01 NOTE — Progress Notes (Cosign Needed Addendum)
 [facility] [address], [address] [place] [address] Main phone: [phone]  Prenatal Visit  Subjective:  Annette Cline is a 38 y.o. [name] at [redacted]w[redacted]d being seen today for ongoing prenatal care.  She is currently monitored for the following issues for this high-risk pregnancy:   Patient Active Problem List   Diagnosis Date Noted   Limited prenatal care in third trimester 03/01/2024   AMA (38 at delivery) 01/26/2024   History of laparoscopic appendectomy 01/15/2024   Encounter for supervision of normal intrauterine pregnancy in multigravida, antepartum 01/15/2024   Tobacco use during pregnancy, antepartum 01/15/2024   Obesity in pregnancy Pregravid BMI=39 01/15/2024   Hx of abnormal cervical Pap smear 01/15/2024   Hx of preterm delivery, currently pregnant 01/15/2024   Dyspnea 01/15/2024   Left ovarian cyst 08/02/2018   CIN III (cervical intraepithelial neoplasia grade III) with severe dysplasia 08/02/2018   HPI Patient reports trouble sleeping, panic attacks, chest heaviness and SOB at night.  Contractions: Not present. Vag. Bleeding: None.  Movement: Present. Denies leaking of fluid/ROM.   The following portions of the patient's history were reviewed and updated as appropriate: allergies, current medications, past family history, past medical history, past social history, past surgical history and problem list. Problem list updated.  Objective:   Vitals:   03/01/24 1053  BP: 121/72  Pulse: 78  Temp: 98 F (36.7 C)  Weight: 233 lb 3.2 oz (105.8 kg)   Total weight gain from pre-pregnancy weight: -6 lb 12.8 oz (-3.084 kg)  Fetal Status: Fetal Heart Rate (bpm): 128 Fundal Height: 31 cm Movement: Present    Fundal height trends reviewed - appropriate for EGA  General:  Alert, oriented and cooperative. Patient is in no acute distress.  Skin: Skin is warm and dry. No rash noted.   Cardiovascular: Normal heart  rate noted  Respiratory: Normal respiratory effort, no problems with respiration noted  Abdomen: Soft, gravid, appropriate for gestational age.  Pain/Pressure: Absent     Pelvic: Cervical exam deferred        Extremities: Normal range of motion.  Edema: None  Mental Status: Normal mood and affect. Normal behavior. Normal judgment and thought content.   Assessment and Plan:  Pregnancy: [name] at [redacted]w[redacted]d  1. [redacted] weeks gestation of pregnancy (Primary)  - Glucose, 1 hour gestational - HIV-1/HIV-2 Qualitative RNA - RPR - Hemoglobin, venipuncture - Tdap declined, counseled  2. Encounter for supervision of normal intrauterine pregnancy in multigravida, antepartum  - She reports feeling uncomfortable in her body during this pregnancy, struggling with panic attacks at night and sleep interruption - BP 121/72 today, no pre-e sx  3. Tobacco use during pregnancy, antepartum  - Smoking about a half a pack/day and she does smoke at night when she wakes up - Drinks hot tea and smokes and it helps calm her down - Strongly advised reduction/cessation of smoking, especially in the home and at night when she experiences panic sx  4. Obesity in pregnancy Pregravid BMI=39  - TWG -6 lbs, reports poor appetite - Does eat cereal, salad, but has trouble feeling hungry and just feels full and uncomfortable - EFW 57.2%ile on 02/08/24 - Weekly NST starting at 37 weeks - Not taking aspirin , doesn't like to take meds.  5. Hx of preterm delivery, currently pregnant  - Denies PTL sx today - Cervix closed, 4cm on US  on 02/08/24 - MFM appt on 03/21/24  6. AMA (38 at delivery)  -  Scheduled for follow up US  on 03/21/24  7. Limited prenatal care in third trimester  - Last seen in clinic on 01/15/24 - Late entry to care at 25 weeks  8. Panic attacks  - Having panic attacks at night where it feels like she can't breathe, feels chest pressure. Has to get up and move around, take a hot shower, uses  YouTube calming sleep videos - This started during her pregnancy, around 2-3 months ago and getting worse - Feels different that an asthma exacerbation, she reports she does NOT typically use her albuterol  at night for these sx, suggested trying this rather than smoking - No chest pain, but chest feels heavy. - Plan for a home sleep apnea test soon once approved by Medicaid/saw pulmonologist on 02/25/24. She was prescribed Prednisone  and Augmentin at this visit. - She does smoke at night/smokes in the house at night - Declines counseling, does not feel she needs medication for these panic episodes - EPDS 8 - denies depression, denies thoughts of self harm - Strongly advised cessation of smoking, particularly at night when she has these symptoms and in the home  Preterm labor symptoms and general obstetric precautions including but not limited to vaginal bleeding, contractions, leaking of fluid and fetal movement were reviewed in detail with the patient. Please refer to After Visit Summary for other counseling recommendations.   Return in 9 days (on 03/10/2024).  Future Appointments  Date Time Provider Department Center  03/10/2024  9:00 AM [facility] PROVIDER AC-MAT None  03/21/2024  7:45 AM [facility] PROVIDER 1 [facility] None  03/21/2024  8:00 AM [facility] US1 [facility]  03/21/2024  9:00 AM [facility] GENETIC RM [facility] None   Damien FORBES Satchel, NP  Attestation of Supervision of Advanced Practitioner (CNM/PA/NP): Evaluation and management procedures were performed by the Advanced Practice Provider under my supervision and collaboration.  I have reviewed the Advanced Practice Provider's note and chart, and I agree with the management and plan. I have also made any necessary editorial changes.   I was working along side this practitioner all day and all medical plans were discussed with me.   Verneta Bers, [place]

## 2024-03-02 ENCOUNTER — Ambulatory Visit: Payer: Self-pay

## 2024-03-02 DIAGNOSIS — Z3480 Encounter for supervision of other normal pregnancy, unspecified trimester: Secondary | ICD-10-CM

## 2024-03-03 LAB — HIV-1/HIV-2 QUALITATIVE RNA
HIV-1 RNA, Qualitative: NONREACTIVE
HIV-2 RNA, Qualitative: NONREACTIVE

## 2024-03-03 LAB — SYPHILIS: RPR W/REFLEX TO RPR TITER AND TREPONEMAL ANTIBODIES, TRADITIONAL SCREENING AND DIAGNOSIS ALGORITHM: RPR Ser Ql: NONREACTIVE

## 2024-03-03 LAB — GLUCOSE, 1 HOUR GESTATIONAL: Gestational Diabetes Screen: 94 mg/dL (ref 70–139)

## 2024-03-03 NOTE — Progress Notes (Signed)
 Reviewed routine 28 week labs. Negative 1 hr GTT, HIV, RPR, and anemia screen. No action needed at this time. To be discussed at next routine prenatal appointment.   Dorothyann Helling, MD 03/03/2024  12:15 PM

## 2024-03-08 ENCOUNTER — Ambulatory Visit: Attending: Sleep Medicine

## 2024-03-08 DIAGNOSIS — E669 Obesity, unspecified: Secondary | ICD-10-CM | POA: Diagnosis not present

## 2024-03-08 DIAGNOSIS — G4710 Hypersomnia, unspecified: Secondary | ICD-10-CM | POA: Insufficient documentation

## 2024-03-08 DIAGNOSIS — Z6838 Body mass index (BMI) 38.0-38.9, adult: Secondary | ICD-10-CM | POA: Insufficient documentation

## 2024-03-08 DIAGNOSIS — R0683 Snoring: Secondary | ICD-10-CM | POA: Insufficient documentation

## 2024-03-08 DIAGNOSIS — G4733 Obstructive sleep apnea (adult) (pediatric): Secondary | ICD-10-CM | POA: Insufficient documentation

## 2024-03-09 ENCOUNTER — Observation Stay
Admission: EM | Admit: 2024-03-09 | Discharge: 2024-03-09 | Disposition: A | Discharge status: Home / Self Care | Attending: Obstetrics and Gynecology | Admitting: Obstetrics and Gynecology

## 2024-03-09 ENCOUNTER — Encounter: Payer: Self-pay | Admitting: *Deleted

## 2024-03-09 ENCOUNTER — Other Ambulatory Visit: Payer: Self-pay

## 2024-03-09 DIAGNOSIS — Z3480 Encounter for supervision of other normal pregnancy, unspecified trimester: Secondary | ICD-10-CM

## 2024-03-09 DIAGNOSIS — O42913 Preterm premature rupture of membranes, unspecified as to length of time between rupture and onset of labor, third trimester: Secondary | ICD-10-CM | POA: Insufficient documentation

## 2024-03-09 DIAGNOSIS — Z3A33 33 weeks gestation of pregnancy: Secondary | ICD-10-CM | POA: Diagnosis not present

## 2024-03-09 DIAGNOSIS — O4193X0 Disorder of amniotic fluid and membranes, unspecified, third trimester, not applicable or unspecified: Principal | ICD-10-CM | POA: Insufficient documentation

## 2024-03-09 DIAGNOSIS — O4290 Premature rupture of membranes, unspecified as to length of time between rupture and onset of labor, unspecified weeks of gestation: Secondary | ICD-10-CM | POA: Diagnosis present

## 2024-03-09 LAB — WET PREP, GENITAL
Clue Cells Wet Prep HPF POC: NONE SEEN
Sperm: NONE SEEN
Trich, Wet Prep: NONE SEEN
WBC, Wet Prep HPF POC: <10 (ref <10)
Yeast Wet Prep HPF POC: NONE SEEN

## 2024-03-09 LAB — RUPTURE OF MEMBRANE (ROM)PLUS: Rom Plus: NEGATIVE

## 2024-03-09 MED ORDER — ACETAMINOPHEN 325 MG PO TABS: 650.0000 mg | ORAL_TABLET | ORAL | Status: DC | PRN

## 2024-03-09 NOTE — OB Triage Note (Signed)
 Patient discharged home in stable condition by provider. Pt reports no pain. Discharge instructions given. Patient verbalized understanding, no questions or concerns. Pt discharged in personal vehicle with support person. Labor precautions given.  Chika Cichowski L. Amanada Philbrick, RN BSN 03/09/2024 7:51 PM

## 2024-03-09 NOTE — Discharge Summary (Signed)
 Patient ID: Annette Cline MRN: [id number] DOB/AGE: 38/23/1987 38 y.o.  Admit date: 03/09/2024 Discharge date: 03/09/2024  Admission Diagnoses: 38 y.o. [name] at [redacted]w[redacted]d present with possible leaking fluid.  She also reports recent cramping but no pain today  Discharge Diagnoses:  Labor: not present.  R/o ROM: ROM+ neg, Wet prep neg. Normal amount of white discharge.  Fetal Wellbeing: NST is Reassuring Cat 1 tracing. D/c home stable, precautions reviewed, follow-up as scheduled.   Factors complicating pregnancy: Tobacco use in pregnancy Obesity Limited prenatal care H/o PTD AMA  Prenatal Procedures: NST  Consults: None  Significant Diagnostic Studies:  Results for orders placed or performed during the hospital encounter of 03/09/24 (from the past week)  Rupture of Membrane (ROM) Plus   Collection Time: 03/09/24  6:25 PM  Result Value Ref Range   Rom Plus NEGATIVE   Wet prep, genital   Collection Time: 03/09/24  6:34 PM  Result Value Ref Range   Yeast Wet Prep HPF POC NONE SEEN NONE SEEN   Trich, Wet Prep NONE SEEN NONE SEEN   Clue Cells Wet Prep HPF POC NONE SEEN NONE SEEN   WBC, Wet Prep HPF POC <10 <10   Sperm NONE SEEN     Treatments: none  Hospital Course:  This is a 38 y.o. [name] with [name] at [redacted]w[redacted]d seen for leaking fluid.  ROM+ and wet prep collected.  She was observed, fetal heart rate monitoring remained reassuring, and she had no signs/symptoms of preterm labor or other maternal-fetal concerns.  She was deemed stable for discharge to home with outpatient follow up.  Discharge Physical Exam:  BP (!) 120/55   Pulse 70   Ht 5' 5 (1.651 m)   Wt 104.3 kg   LMP 07/20/2023 (Exact Date)   BMI 38.27 kg/m   General: NAD CV: RRR Pulm: nl effort ABD: s/nd/nt, gravid DVT Evaluation: LE non-ttp, no evidence of DVT on exam.  NST: FHR baseline: 140 bpm Variability: moderate Accelerations: yes Decelerations: none Category/reactivity: reactive   TOCO:  quiet SVE: deferred      Discharge Condition: Stable  Disposition: Discharge disposition: 01-Home or Self Care        Allergies as of 03/09/2024       Reactions   Ciprofloxacin Hives, Palpitations   Sulfa Antibiotics Rash, Hives        Medication List     STOP taking these medications    amoxicillin -clavulanate 875-125 MG tablet Commonly known as: AUGMENTIN   budesonide -formoterol  160-4.5 MCG/ACT inhaler Commonly known as: Symbicort    guaiFENesin -codeine  100-10 MG/5ML syrup Commonly known as: ROBITUSSIN AC   predniSONE  10 MG tablet Commonly known as: DELTASONE        TAKE these medications    albuterol  108 (90 Base) MCG/ACT inhaler Commonly known as: VENTOLIN  HFA Inhale 2 puffs into the lungs every 4 (four) hours as needed for wheezing or shortness of breath.   aspirin  EC 81 MG tablet Take 1 tablet (81 mg total) by mouth daily. Swallow whole.   multivitamin-prenatal 27-0.8 MG Tabs tablet Take 1 tablet by mouth daily at 12 noon. What changed: Another medication with the same name was removed. Continue taking this medication, and follow the directions you see here.        Follow-up Information     Department, [facility] Follow up.   Why: Keep all scheduled appointments Contact information: [address] [place] [address] [phone]  SignedBETHA DELON COE, CNM 03/09/2024 7:50 PM

## 2024-03-09 NOTE — OB Triage Note (Signed)
 Resident [phone] with 33 weeks 3 day gestation was sent  from the ED with the HX of leaking fluids since this morning. Patient was not sure if it was urine or membrane fluid leaking .She has a history of preterm labors with her pregnancies She denies being in pain . FHR was within normal limit. Vital signs checked and recorded . Midwife Wyvonna informed and ordered for swabs to be taken-same done and sent to lab. Made comfortable in bed

## 2024-03-10 ENCOUNTER — Ambulatory Visit

## 2024-03-10 ENCOUNTER — Encounter: Payer: Self-pay | Admitting: Physician Assistant

## 2024-03-10 VITALS — BP 96/66 | HR 76 | Temp 97.1°F | Wt 232.4 lb

## 2024-03-10 DIAGNOSIS — O99213 Obesity complicating pregnancy, third trimester: Secondary | ICD-10-CM

## 2024-03-10 DIAGNOSIS — O09899 Supervision of other high risk pregnancies, unspecified trimester: Secondary | ICD-10-CM

## 2024-03-10 DIAGNOSIS — O09523 Supervision of elderly multigravida, third trimester: Secondary | ICD-10-CM

## 2024-03-10 DIAGNOSIS — Z3483 Encounter for supervision of other normal pregnancy, third trimester: Secondary | ICD-10-CM

## 2024-03-10 DIAGNOSIS — O99330 Smoking (tobacco) complicating pregnancy, unspecified trimester: Secondary | ICD-10-CM

## 2024-03-10 DIAGNOSIS — J45909 Unspecified asthma, uncomplicated: Secondary | ICD-10-CM

## 2024-03-10 DIAGNOSIS — O99210 Obesity complicating pregnancy, unspecified trimester: Secondary | ICD-10-CM

## 2024-03-10 DIAGNOSIS — O99333 Smoking (tobacco) complicating pregnancy, third trimester: Secondary | ICD-10-CM

## 2024-03-10 DIAGNOSIS — Z3480 Encounter for supervision of other normal pregnancy, unspecified trimester: Secondary | ICD-10-CM

## 2024-03-10 DIAGNOSIS — Z3A33 33 weeks gestation of pregnancy: Secondary | ICD-10-CM

## 2024-03-10 NOTE — Progress Notes (Addendum)
 Counseled on recommendation for RSV vaccine in pregnancy and vaccine declined. RSV VIS given. Client kept 02/25/24 pulmonology appt. Burnadette Lowers, RN Review of US  by provider changed EGA from 33 to 28 weeks. On 03/01/24, client had 28 week labs and was [redacted] weeks pregnant. Burnadette Lowers, RN

## 2024-03-10 NOTE — Progress Notes (Signed)
 [facility] [address], [address] [place] [address] Main phone: [phone]  Prenatal Visit  Subjective:  Annette Cline is a 38 y.o. [name] at [redacted]w[redacted]d being seen today for ongoing prenatal care.  She is currently monitored for the following issues for this high-risk pregnancy:   Patient Active Problem List   Diagnosis Date Noted   Limited prenatal care in third trimester 03/01/2024   AMA (38 at delivery) 01/26/2024   History of laparoscopic appendectomy 01/15/2024   Encounter for supervision of normal intrauterine pregnancy in multigravida, antepartum 01/15/2024   Tobacco use during pregnancy, antepartum 01/15/2024   Obesity in pregnancy Pregravid BMI=39 01/15/2024   Hx of abnormal cervical Pap smear 01/15/2024   Hx of preterm delivery, currently pregnant 01/15/2024   Reactive airway disease 01/15/2024   Left ovarian cyst 08/02/2018   CIN III (cervical intraepithelial neoplasia grade III) with severe dysplasia 08/02/2018   HPI Patient reports no complaints.  Contractions: Not present. Vag. Bleeding: None.  Movement: Present. Denies leaking of fluid/ROM.   The following portions of the patient's history were reviewed and updated as appropriate: allergies, current medications, past family history, past medical history, past social history, past surgical history and problem list. Problem list updated.  Objective:   Vitals:   03/10/24 0904  BP: 96/66  Pulse: 76  Temp: (!) 97.1 F (36.2 C)  Weight: 232 lb 6.4 oz (105.4 kg)   Total weight gain from pre-pregnancy weight: -7 lb 9.6 oz (-3.447 kg)  Fetal Status: Fetal Heart Rate (bpm): 144 Fundal Height: 30 cm Movement: Present    Fundal height trends reviewed - appropriate for EGA  General:  Alert, oriented and cooperative. Patient is in no acute distress.  Skin: Skin is warm and dry. No rash noted.   Cardiovascular: Normal heart rate noted  Respiratory: Normal  respiratory effort, no problems with respiration noted  Abdomen: Soft, gravid, appropriate for gestational age.  Pain/Pressure: Absent     Pelvic: Cervical exam deferred        Extremities: Normal range of motion.  Edema: None  Mental Status: Normal mood and affect. Normal behavior. Normal judgment and thought content.   Assessment and Plan:  Pregnancy: [name] at [redacted]w[redacted]d  1. [redacted] weeks gestation of pregnancy (Primary) Reviewed 03/09/24 L&D eval of suspected ROM - no ROM, discharged home after eval.  2. Encounter for supervision of normal intrauterine pregnancy in multigravida, antepartum Feels well today. Taking prenatal vitamins. Confirmed most appropriate EDC is 05/29/24 per 02/08/24 US  with Dr. Macario and patient, so pt is [redacted]w[redacted]d today. 1h GTT already done and WNL. Enc to keep f/u US  as sched 03/21/24.  3. AMA (38 at delivery) Not taking LDASA, no benefit to starting now at 28 weeks.  4. Obesity in pregnancy Pregravid BMI=39 Has lost 7 lbs for total preg, but 1 lb since last visit. Attributes this to healthier diet since no longer working at [employer]. Continue healthy diet.  5. Tobacco use during pregnancy, antepartum Not interested in smoking cessation at present - had smoking cessation counseling at 02/25/24 pulm consult.  6. Hx of preterm delivery, currently pregnant No PTL sx, will continue to monitor.  7. Reactive airway disease without complication, unspecified asthma severity, unspecified whether persistent Reviewed 02/25/24 Pulm consult - dx with reactive airway disease and counseled re: smoking cessation (declined). Taking Symbicort  and has albuterol  for prn use. Did not take prednisone  and only took 4 of the antibiotic pills (did not  like their taste). Feels breathing is much improved. I counseled importance of completing medication as instructed for full benefit and to contact prescriber if problems doing so in order for adjustments to be made.    Preterm labor symptoms  and general obstetric precautions including but not limited to vaginal bleeding, contractions, leaking of fluid and fetal movement were reviewed in detail with the patient. Please refer to After Visit Summary for other counseling recommendations.  Return in about 2 weeks (around 03/24/2024) for Routine prenatal care.  Future Appointments  Date Time Provider Department Center  03/21/2024  7:45 AM [facility] PROVIDER 1 [facility] [place]  03/21/2024  8:00 AM [facility] US  1 [facility] [place]  03/21/2024  9:00 AM [facility] GENETIC COUNSELING RM [facility] [place]    Dyamond Tolosa, PA-C

## 2024-03-21 ENCOUNTER — Other Ambulatory Visit: Payer: Self-pay

## 2024-03-21 ENCOUNTER — Ambulatory Visit: Attending: Maternal & Fetal Medicine

## 2024-03-21 ENCOUNTER — Ambulatory Visit: Payer: Self-pay | Admitting: Maternal & Fetal Medicine

## 2024-03-21 ENCOUNTER — Ambulatory Visit

## 2024-03-21 ENCOUNTER — Ambulatory Visit: Payer: Self-pay | Admitting: Family Medicine

## 2024-03-21 VITALS — BP 102/74 | HR 74

## 2024-03-21 DIAGNOSIS — Z641 Problems related to multiparity: Secondary | ICD-10-CM | POA: Insufficient documentation

## 2024-03-21 DIAGNOSIS — O99330 Smoking (tobacco) complicating pregnancy, unspecified trimester: Secondary | ICD-10-CM

## 2024-03-21 DIAGNOSIS — O99210 Obesity complicating pregnancy, unspecified trimester: Secondary | ICD-10-CM

## 2024-03-21 DIAGNOSIS — Z3480 Encounter for supervision of other normal pregnancy, unspecified trimester: Secondary | ICD-10-CM

## 2024-03-21 DIAGNOSIS — O09899 Supervision of other high risk pregnancies, unspecified trimester: Secondary | ICD-10-CM

## 2024-03-21 DIAGNOSIS — O0933 Supervision of pregnancy with insufficient antenatal care, third trimester: Secondary | ICD-10-CM

## 2024-03-21 DIAGNOSIS — O09523 Supervision of elderly multigravida, third trimester: Secondary | ICD-10-CM

## 2024-03-21 NOTE — Progress Notes (Signed)
 Reviewed MFM US . EFW 33%. EGA c/w US  at [redacted]w[redacted]d. Incomplete anatomy study due to fetal positioning. Recommend repeat anatomy in 4 weeks. Already scheduled for 1/26.  Dorothyann Helling, MD 03/21/2024  3:26 PM

## 2024-03-21 NOTE — Progress Notes (Signed)
 In Person Genetic Counseling Consultation  Referring Provider:  Macario Dorothyann HERO Length of Consultation: 25 minutes  Ms. Annette Cline was referred to Maternal Fetal Care at [facility] for genetic counseling because of advanced maternal age.  The patient will be 38 years old at the time of delivery.  This note summarizes the information we discussed. Annette Cline was present at this visit with her adult daughter.  We explained that the chance of a chromosome abnormality increases with maternal age.  Chromosomes and examples of chromosome problems were reviewed.  Humans typically have 46 chromosomes in each cell, with half passed through each sperm and egg.  Any change in the number or structure of chromosomes can increase the risk of problems in the physical and mental development of a pregnancy.   Based upon age of the patient and the current gestational age, the chance of any chromosome abnormality was 1 in 53. The chance of Down syndrome, the most common chromosome problem associated with maternal age, was 1 in 64.  The risk of chromosome problems is in addition to the 3% general population risk for birth defects and intellectual disabilities.  The greatest chance, of course, is that the baby would be born in good health.  We discussed the following prenatal screening and testing options for this pregnancy:  Cell free fetal DNA testing analyzes maternal blood to determine whether or not the baby may have Down syndrome, trisomy 69, or trisomy 43.  This test utilizes a maternal blood sample and DNA sequencing technology to isolate circulating cell free fetal DNA from maternal plasma.  The fetal DNA can then be analyzed for DNA sequences that are derived from the three most common chromosomes involved in aneuploidy, chromosomes 13, 18, and 21.  If the overall amount of DNA is greater than the expected level for any of these chromosomes, aneuploidy is suspected.  While we do not consider it a replacement for  invasive testing and karyotype analysis, a negative result from this testing would be reassuring, though not a guarantee of a normal chromosome complement for the baby.  An abnormal result is certainly suggestive of an abnormal chromosome complement, though we would still recommend CVS or amniocentesis to confirm any findings from this testing.  [name] had prior cell free fetal DNA testing in this pregnancy which was negative for Trisomy 13, 18 and 21.  Results were also low risk for sex chromosome aneuploidies and common microdeletion syndromes.  The predicted fetal gender is female.  This result significantly reduces the chance for these conditions in the pregnancy, but is still considered a screening test. See the report for details and residual risk information.     Targeted ultrasound uses high frequency sound waves to create an image of the developing fetus.  An ultrasound is often recommended as a routine means of evaluating the pregnancy.  It is also used to screen for fetal anatomy problems (for example, a heart defect) that might be suggestive of a chromosomal or other abnormality.   Amniocentesis involves the removal of a small amount of amniotic fluid from the sac surrounding the fetus with the use of a thin needle inserted through the maternal abdomen and uterus.  Ultrasound guidance is used throughout the procedure.  Fetal cells from amniotic fluid are directly evaluated and > 99.5% of chromosome problems and > 98% of open neural tube defects can be detected. This procedure is generally performed after the 15th week of pregnancy.  The main risks to this procedure include  complications leading to miscarriage in less than 1 in 200 cases (0.5%).  Carrier screening. Per the ACOG Committee Opinion 691, all women who are considering a pregnancy or are currently pregnant should be offered carrier screening for, at minimum, Cystic Fibrosis (CF), Spinal Muscular Atrophy (SMA), and Hemoglobinopathies The  mode of inheritance, clinical manifestations of these conditions, as well as details about testing were reviewed. A negative result on carrier screening reduces the likelihood of being a carrier, however, does not entirely rule out the possibility. If Annette Cline was found to be a carrier for a specific condition, carrier screening for their reproductive partner would be recommended. Prior testing for for beta hemoglobinopathies was negative (AA).  [facility] declined screening for CF, SMA and alpha thalassemia at this time.  Family history and pregnancy history. We obtained a detailed family history and pregnancy history.  This is the tenth pregnancy for Annette Cline, the first with her current partner, Annette Cline.  He has one healthy 5 year old son.  Annette Cline has five healthy children from prior relationships. She also had two miscarriages and two terminations for personal reasons. Three of her children were born premature. The youngest, a 45 year old son, is reported to have a port wine stain.  He is otherwise in good health with normal growth and development.  We reviewed that most port wine stains occur as a sporadic, isolated condition due to abnormally formed capillaries (small blood vessels). Port wine stains can be associated with some genetic syndromes in which abnormal blood vessels can be present in other parts of the body. Annette Cline indicated that his doctors were watching him but felt his condition was isolated. Also in the family history, Annette Cline stated that several family members has different types of cancers at older ages, with multiple relatives having cervical cancer.  Other cancers were nonspecific.  If she becomes concerned about this history, we are happy to connect her with a cancer genetic counselor, though we explained that most cancers occur by chance and are not due to inherited genetic changes. The remainder of the family history is unremarkable for birth defects, developmental delays, recurrent pregnancy loss  or known chromosome abnormalities.  Annette Cline reported no complications in this pregnancy and no exposure to alcohol, recreational drugs or prescription medications.  She does smoke cigarettes.  Ms. Christensen was encouraged to call with questions or concerns.  We can be contacted at [phone].  Plan of Care: Ultrasound completed today, see that report for details. [facility] declined amniocentesis as well as carrier screening.  Barnie PHEBE Dixons, MS, CGC  In total, 40 minutes were spent on the date of the encounter in service to the patient including preparation, record review, face-to-face consultation, documentation and care coordination.

## 2024-03-24 NOTE — L&D Delivery Note (Signed)
 Received report at 2250. This RN discussed plan of care with patient, reviewed history and allergies. Patient reports feeling terrified of c-section. Discussed concerns, this RN provided emotional support, patient tearful at bedside. Family seen providing emotional support. Room checks complete, call bell within reach.

## 2024-03-24 NOTE — L&D Delivery Note (Signed)
 [facility] Labor & Delivery Labor Progress Note  SUBJECTIVE   In to assess patient during decel   No interpreter was used for this encounter.  OBJECTIVE   BP 110/63   Pulse 71   Temp 36.8 C (98.3 F) (Oral)   Resp 18   Ht 165.1 cm (5' 5)   Wt (!) 102.7 kg (226 lb 8 oz)   LMP 07/20/2023   SpO2 100%   BMI 37.69 kg/m   SVE: SVE performed and a chaperone was present  Student Involvement: None   0300 3/50/-2, membrane sweep.   0515    unchanged, IUPC, FSE placed, pit off  - Pitocin  ordered - Membrane Status: Preterm, Prolonged, Rupture Date: 04/07/24, Rupture Time: 0030, Fluid Color: Clear - PPH Risk Score: 9 (04/12/24 0540); High risk (4+) - reviewed. T&Cx 2u - Pain Control: Epidural in place  FHT:  - 140 bpm / Moderate (6-25 bpm) / Accels: Present / Decels: Late and variable decels - Toco: irregular   ASSESSMENT AND PLAN   ARIAN MCQUITTY 39 y.o. [id number] at [redacted]w[redacted]d admitted for Induction of Labor for PPROM and NRFHT: - Indication for IOL: PPROM and NRFHT - Cervical ripening: pitocin  - Induction agents: oxytocin , no forebag palpated - Labor curve reviewed, continue induction. IUPC, FSE, pit turned off for variable and late decelerations. Discussed plan with patient to restsart pit if tracing improves for 30 minutes. If pit restarted or if tracing remains Cat II and cannot restart, will plan to proceed with pCS.   Fetal:  - Fetal heart tracing reviewed. Category 2 Maternal repositioning Previously reviewed concern for NRFHT and increased risk of fetal distress requiring urgent or emergent CS. Electing to try to restart pit once more.   Active Problems:  PPROM - Latency antibiotics 1/15-1/20 - s/p BMZ 1/15-1/16  Tobacco Use Disorder - 0.5 pack of cigarettes daily.  - Ordered for nicotine  patch and gum PRN while hospitalized.    Grand parity - [id number] - Admit Hgb 11.6 (1/15) - T&C x 2u   History of Laparoscopic Appendectomy - 01/15/2024    Reactive airway disease - Albuterol  inhaler ordered, using twice daily - Avoid hemabate   MOD: Anticipate vaginal delivery Consented for C/S on admission for fetal distress and to include b/l salpingectomy. Signed medicaid consents with previous OBGYN, resigned 1/15 on admission.   PNC: [place] [facility] (FM) A POS / RI / VI / Tdap declined previously / COV will assess / Flu will assess / RSV will assess Contraception: tubal ligation / Feeding: Breast   VTE Prophylaxis: SCDs in bed  Connee Sauger, MD 04/12/2024  5:41 AM

## 2024-03-24 NOTE — L&D Delivery Note (Signed)
 [facility] Labor & Delivery Labor Progress Note  SUBJECTIVE   Doing well s/p epidural with ongoing itching. Amenable to SVE  No interpreter was used for this encounter.  OBJECTIVE   BP 109/51   Pulse 76   Temp 36.8 C (98.3 F) (Oral)   Resp 18   Ht 165.1 cm (5' 5)   Wt (!) 102.7 kg (226 lb 8 oz)   LMP 07/20/2023   SpO2 100%   BMI 37.69 kg/m   SVE: SVE performed and a chaperone was present  Student Involvement: None   0300 3/50/-2, membrane sweep.  - Pitocin  ordered - Membrane Status: Preterm, Prolonged, Rupture Date: 04/07/24, Rupture Time: 0030, Fluid Color: Clear - PPH Risk Score: 9 (04/12/24 0300); High risk (4+) - reviewed. T&Cx 2u - Pain Control: Epidural in place  FHT:  - 145 bpm / Moderate (6-25 bpm) / Accels: Present / Decels: Late and variable decels - Toco: irregular  ASSESSMENT AND PLAN   Annette Cline 39 y.o. [id number] at [redacted]w[redacted]d admitted for Induction of Labor for PPROM and NRFHT: - Indication for IOL: PPROM and NRFHT - Cervical ripening: pitocin  - Induction agents: oxytocin , forebag palpated. Will attempt AROM at next SVE - Labor curve reviewed, continue induction  Fetal:  - Fetal heart tracing reviewed. Category 2 Maternal repositioning Previously reviewed concern for NRFHT and increased risk of fetal distress requiring urgent or emergent CS. She has elected to proceed with IOL for now and is also now s/p epidural in case of need for stat CS.  Active Problems:  PPROM - Latency antibiotics 1/15-1/20 - s/p BMZ 1/15-1/16  Tobacco Use Disorder - 0.5 pack of cigarettes daily.  - Ordered for nicotine  patch and gum PRN while hospitalized.    Grand parity - [id number] - Admit Hgb 11.6 (1/15) - T&C x 2u   History of Laparoscopic Appendectomy - 01/15/2024   Reactive airway disease - Albuterol  inhaler ordered, using twice daily - Avoid hemabate   MOD: Anticipate vaginal delivery Consented for C/S on admission for fetal distress and  to include b/l salpingectomy. Signed medicaid consents with previous OBGYN, resigned 1/15 on admission.   PNC: [place] [facility] (FM) A POS / RI / VI / Tdap declined previously / COV will assess / Flu will assess / RSV will assess Contraception: tubal ligation / Feeding: Breast   VTE Prophylaxis: SCDs in bed  Arvin Russell Saltness, MD 04/12/2024  3:05 AM

## 2024-03-25 ENCOUNTER — Ambulatory Visit: Admitting: Family Medicine

## 2024-03-25 VITALS — BP 111/77 | HR 78 | Temp 97.1°F | Wt 228.6 lb

## 2024-03-25 DIAGNOSIS — O99213 Obesity complicating pregnancy, third trimester: Secondary | ICD-10-CM

## 2024-03-25 DIAGNOSIS — J45909 Unspecified asthma, uncomplicated: Secondary | ICD-10-CM

## 2024-03-25 DIAGNOSIS — Z3483 Encounter for supervision of other normal pregnancy, third trimester: Secondary | ICD-10-CM

## 2024-03-25 DIAGNOSIS — O99210 Obesity complicating pregnancy, unspecified trimester: Secondary | ICD-10-CM

## 2024-03-25 DIAGNOSIS — Z3480 Encounter for supervision of other normal pregnancy, unspecified trimester: Secondary | ICD-10-CM

## 2024-03-25 DIAGNOSIS — O09523 Supervision of elderly multigravida, third trimester: Secondary | ICD-10-CM

## 2024-03-25 DIAGNOSIS — O09899 Supervision of other high risk pregnancies, unspecified trimester: Secondary | ICD-10-CM

## 2024-03-25 DIAGNOSIS — G4733 Obstructive sleep apnea (adult) (pediatric): Secondary | ICD-10-CM

## 2024-03-25 DIAGNOSIS — O99330 Smoking (tobacco) complicating pregnancy, unspecified trimester: Secondary | ICD-10-CM

## 2024-03-25 DIAGNOSIS — Z3A30 30 weeks gestation of pregnancy: Secondary | ICD-10-CM

## 2024-03-25 NOTE — Progress Notes (Signed)
 " [facility] [address], [address] [place] [address] Main phone: [phone]  Prenatal Visit  Subjective:  Annette Cline is a 39 y.o. [name] at [redacted]w[redacted]d being seen today for ongoing prenatal care.  She is currently monitored for the following issues for this high-risk pregnancy:   Patient Active Problem List   Diagnosis Date Noted   Grand multiparity 03/21/2024   Limited prenatal care in third trimester 03/01/2024   AMA (38 at delivery) 01/26/2024   History of laparoscopic appendectomy 01/15/2024   Encounter for supervision of normal intrauterine pregnancy in multigravida, antepartum 01/15/2024   Tobacco use during pregnancy, antepartum 01/15/2024   Obesity in pregnancy Pregravid BMI=39 01/15/2024   Hx of abnormal cervical Pap smear 01/15/2024   Hx of preterm delivery, currently pregnant 01/15/2024   Reactive airway disease 01/15/2024   Left ovarian cyst 08/02/2018   CIN III (cervical intraepithelial neoplasia grade III) with severe dysplasia 08/02/2018   HPI Patient reports occasional cramping with exertion.  Contractions: Irritability (Cramping 2x daily with exertion,m 3 min each, improved with rest). Vag. Bleeding: None.  Movement: Present. Denies leaking of fluid/ROM.   The following portions of the patient's history were reviewed and updated as appropriate: allergies, current medications, past family history, past medical history, past social history, past surgical history and problem list. Problem list updated.  Objective:   Vitals:   03/25/24 1056  BP: 111/77  Pulse: 78  Temp: (!) 97.1 F (36.2 C)  Weight: 228 lb 9.6 oz (103.7 kg)   Total weight gain from pre-pregnancy weight: -11 lb 6.4 oz (-5.171 kg)  Fetal Status: Fetal Heart Rate (bpm): 140 Fundal Height: 30 cm Movement: Present    Fundal height trends reviewed - appropriate for EGA  General:  Alert, oriented and cooperative. Patient is in no  acute distress.  Skin: Skin is warm and dry. No rash noted.   Cardiovascular: Normal heart rate noted  Respiratory: Normal respiratory effort, no problems with respiration noted  Abdomen: Soft, gravid, appropriate for gestational age.  Pain/Pressure: Absent     Pelvic: Cervical exam deferred        Extremities: Normal range of motion.     Mental Status: Normal mood and affect. Normal behavior. Normal judgment and thought content.   Assessment and Plan:  Pregnancy: [name] at [redacted]w[redacted]d  [redacted] weeks gestation of pregnancy  Encounter for supervision of normal intrauterine pregnancy in multigravida, antepartum Assessment & Plan: Doing well. BP normal. TWG -11 lb 6.4 oz (-5.171 kg). Taking prenatal vitamin daily. Next prenatal appointment in 2 weeks.   Discussed or disclosed in AVS: - Breast feeding support through [facility] breast feeding peer counselor program - Mood resources, including 988, Maternal Mental Health Line, and counselor Annette Hail, LCSW, here at [facility] - Prenatal classes   Obesity in pregnancy Pregravid BMI=39 Assessment & Plan: Tolerating aspirin  81 mg daily. TWG -11 lb 6.4 oz (-5.171 kg) thus far, has lost ~ 3 lb since last visit. Annette Cline attributes this to abrupt change in eating patterns, now eating much healthier that she is no longer working at [employer].   3rd trim growth US  done 12/29 at MFM. F/u late Jan scheduled.   Plan for weekly BPP starting [redacted]w[redacted]d.    Hx of preterm delivery, currently pregnant Assessment & Plan: MFM on board. No s/s PTL at this time. Precautions discussed.    Reactive airway disease without complication, unspecified asthma severity, unspecified whether persistent Assessment &  Plan: Albuterol  daily for wheezing and chest tightness.   Saw pulmonology 02/25/24. No PFT at that time due to concurrent URI. Rx symbicort . Annette Cline reports she has not started the Symbicort  because it felt like the pulmonologist was throwing meds at me  with diagnosing and because she found online reviews about side effects such as hair loss and nose bleeds. Also, pulmonologist not great personality fit for Annette Cline. Discussed how Symbicort  is a useful inhaler for daily maintenance to reduce amount of PRN albuterol  used. Validated her concerns and recommended she start.   Still smoking about 1/2 PPD. Not interested in quitting. Has notably cut down from 1 PPD to 1/2 PPD. Encouraged further reduction as able to reduce risk and keep her baby healthy.    AMA (38 at delivery) Assessment & Plan: S/p MFM 12/29, EFW 33%, incomplete study. Declined carrier screening and amniocentesis. Has appointments to return to MFM 1/26 and 2/23.    Tobacco use during pregnancy, antepartum Assessment & Plan: Still smoking about 1/2 PPD. Not interested in quitting. Has notably cut down from 1 PPD to 1/2 PPD. Encouraged further reduction as able to reduce risk and keep her baby healthy.      Preterm labor symptoms and general obstetric precautions including but not limited to vaginal bleeding, contractions, leaking of fluid and fetal movement were reviewed in detail with the patient. Please refer to After Visit Summary for other counseling recommendations.  No follow-ups on file.  Future Appointments  Date Time Provider Department Center  04/18/2024  4:00 PM [facility] US  1 [facility] [place]  04/19/2024 10:00 AM [facility] PROVIDER AC-[facility] None  04/25/2024  9:00 AM [facility] NST [facility] [place]  05/02/2024  9:00 AM [facility] NST [facility] [place]  05/09/2024  9:00 AM [facility] NST [facility] [place]  05/16/2024  9:00 AM [facility]- US  1 [facility] [place]    Annette CHRISTELLA Helling, MD "

## 2024-03-25 NOTE — Assessment & Plan Note (Signed)
 Tolerating aspirin  81 mg daily. TWG -11 lb 6.4 oz (-5.171 kg) thus far, has lost ~ 3 lb since last visit. Annette Cline attributes this to abrupt change in eating patterns, now eating much healthier that she is no longer working at [employer].   3rd trim growth US  done 12/29 at MFM. F/u late Jan scheduled.   Plan for weekly BPP starting [redacted]w[redacted]d.

## 2024-03-25 NOTE — Assessment & Plan Note (Signed)
 MFM on board. No s/s PTL at this time. Precautions discussed.

## 2024-03-25 NOTE — Assessment & Plan Note (Signed)
 Still smoking about 1/2 PPD. Not interested in quitting. Has notably cut down from 1 PPD to 1/2 PPD. Encouraged further reduction as able to reduce risk and keep her baby healthy.

## 2024-03-25 NOTE — Assessment & Plan Note (Signed)
 Albuterol  daily for wheezing and chest tightness.   Saw pulmonology 02/25/24. No PFT at that time due to concurrent URI. Rx symbicort . Annette Cline reports she has not started the Symbicort  because it felt like the pulmonologist was throwing meds at me with diagnosing and because she found online reviews about side effects such as hair loss and nose bleeds. Also, pulmonologist not great personality fit for Annette Cline. Discussed how Symbicort  is a useful inhaler for daily maintenance to reduce amount of PRN albuterol  used. Validated her concerns and recommended she start.   Still smoking about 1/2 PPD. Not interested in quitting. Has notably cut down from 1 PPD to 1/2 PPD. Encouraged further reduction as able to reduce risk and keep her baby healthy.

## 2024-03-25 NOTE — Assessment & Plan Note (Signed)
 S/p MFM 12/29, EFW 33%, incomplete study. Declined carrier screening and amniocentesis. Has appointments to return to MFM 1/26 and 2/23.

## 2024-03-25 NOTE — Patient Instructions (Signed)
 Breast pump - Have your insurance fax the prescription sheet to [phone]  Pregnancy Continue taking your prenatal vitamin daily.  Please schedule your next prenatal visit for about 2 weeks from now.  Please visit WIC (women's, infants, and children program) to see about their breast feeding peer support program. You can start this program to get tips and tricks for successful breast feeding of your baby.   Prenatal Classes If delivering at [facility] with [facility] or [facility]: Go to [email or web address]   If delivering at [facility]: Go to [email or web address] and search for: Pregnancy and Parenting Classes Prepared Childbirth Classes  Text4baby and www.text4baby.org  Text BABY to [phone] to sign up for text messages to your phone 3 times per week throughout your pregnancy and baby's first year.  These texts include resources and tips on many things, including:  - safe sleep - breastfeeding - car seat safety - nutrition - exercise  Resource regarding exposures that could affect pregnancy and breast-feeding: Mother To [employer] is a dentist with lots of information on the effects of many medications and exposures on your pregnancy. It is free to use, including their web site, phone line, text service, app, or email and live chat.  http://golden-thomas.org/ Email or live chat: MotherToBaby.org Phone: [phone] Text: 609-505-2279 App: search [employer]   Maternal Mental Health If you start to develop the below symptoms of depression, please reach out to us  for an appointment. There is also a Biomedical Scientist Health Hotline at (929)[phone]). This hotline has trained counselors, doulas, and midwifes to real-time support, information, and resources.  Feeling sad or hopeless most of the time Lack of interest in things you used to enjoy Less interest in caring for  yourself (dressing, fixing hair) Trouble concentrating Trouble coping with daily tasks Constant worry about your baby Sleeping or eating too much or too little Feeling very anxious or nervous Unexplained irritability or anger Unwanted or scary thoughts Feeling that you are not a good mother Thoughts of hurting yourself or your baby  If you feel you are experiencing a mental health crisis, please reach out to the National Suicide Prevention Hotline at 1-800-273-TALK (847)772-5867).

## 2024-04-01 NOTE — Addendum Note (Signed)
 Addended by: Toshika Parrow on: 04/01/2024 02:19 PM   Modules accepted: Orders

## 2024-04-04 ENCOUNTER — Ambulatory Visit: Payer: Self-pay | Admitting: Sleep Medicine

## 2024-04-04 ENCOUNTER — Ambulatory Visit

## 2024-04-04 VITALS — BP 105/67 | HR 75 | Temp 97.8°F | Wt 229.0 lb

## 2024-04-04 DIAGNOSIS — O99330 Smoking (tobacco) complicating pregnancy, unspecified trimester: Secondary | ICD-10-CM

## 2024-04-04 DIAGNOSIS — J45909 Unspecified asthma, uncomplicated: Secondary | ICD-10-CM

## 2024-04-04 DIAGNOSIS — Z3483 Encounter for supervision of other normal pregnancy, third trimester: Secondary | ICD-10-CM

## 2024-04-04 DIAGNOSIS — O09899 Supervision of other high risk pregnancies, unspecified trimester: Secondary | ICD-10-CM

## 2024-04-04 DIAGNOSIS — Z3480 Encounter for supervision of other normal pregnancy, unspecified trimester: Secondary | ICD-10-CM

## 2024-04-04 DIAGNOSIS — G4733 Obstructive sleep apnea (adult) (pediatric): Secondary | ICD-10-CM

## 2024-04-04 DIAGNOSIS — O99213 Obesity complicating pregnancy, third trimester: Secondary | ICD-10-CM

## 2024-04-04 DIAGNOSIS — Z3A32 32 weeks gestation of pregnancy: Secondary | ICD-10-CM

## 2024-04-04 DIAGNOSIS — O99210 Obesity complicating pregnancy, unspecified trimester: Secondary | ICD-10-CM

## 2024-04-04 DIAGNOSIS — O09523 Supervision of elderly multigravida, third trimester: Secondary | ICD-10-CM

## 2024-04-04 NOTE — Progress Notes (Addendum)
 Here today for 32.1 week MH RV. Taking PNV and using Inhaler as needed. Not taking daily ASA. Reports has been to the urgent care for my blood pressure a few days ago I go all of the time. States was told she was fine. Kick count cards and instructions given. Declines RSV vaccine. BTL papers signed and copy given to patient as well as  educational booklet given. Niels Devonshire, RN

## 2024-04-04 NOTE — Progress Notes (Signed)
 " [facility] [address] [place] [address] Main phone: [phone]  Prenatal Visit  Subjective:  Annette Cline is a 39 y.o. [name] at [redacted]w[redacted]d being seen today for ongoing prenatal care.  She is currently monitored for the following issues for this high-risk pregnancy:   Patient Active Problem List   Diagnosis Date Noted   Grand multiparity 03/21/2024   Limited prenatal care in third trimester 03/01/2024   AMA (38 at delivery) 01/26/2024   History of laparoscopic appendectomy 01/15/2024   Encounter for supervision of normal intrauterine pregnancy in multigravida, antepartum 01/15/2024   Tobacco use during pregnancy, antepartum 01/15/2024   Obesity in pregnancy Pregravid BMI=39 01/15/2024   Hx of abnormal cervical Pap smear 01/15/2024   Hx of preterm delivery, currently pregnant 01/15/2024   Reactive airway disease 01/15/2024   Left ovarian cyst 08/02/2018   CIN III (cervical intraepithelial neoplasia grade III) with severe dysplasia 08/02/2018   HPI Patient reports wheezing at night.  Contractions: Irregular. Vag. Bleeding: None.  Movement: Present. Denies leaking of fluid/ROM.   The following portions of the patient's history were reviewed and updated as appropriate: allergies, current medications, past family history, past medical history, past social history, past surgical history and problem list. Problem list updated.  Objective:   Vitals:   04/04/24 1023  BP: 105/67  Pulse: 75  Temp: 97.8 F (36.6 C)  Weight: 229 lb (103.9 kg)   Total weight gain from pre-pregnancy weight: -11 lb (-4.99 kg)  Fetal Status: Fetal Heart Rate (bpm): 122 Fundal Height: 33 cm Movement: Present    Fundal height trends reviewed - appropriate for EGA  General:  Alert, oriented and cooperative. Patient is in no acute distress.  Skin: Skin is warm and dry. No rash noted.   Cardiovascular: Normal heart rate noted   Respiratory: Normal respiratory effort, no problems with respiration noted  Abdomen: Soft, gravid, appropriate for gestational age.  Pain/Pressure: Present     Pelvic: Cervical exam deferred        Extremities: Normal range of motion.  Edema: None  Mental Status: Normal mood and affect. Normal behavior. Normal judgment and thought content.   Assessment and Plan:  Pregnancy: [name] at [redacted]w[redacted]d  1. [redacted] weeks gestation of pregnancy (Primary)   2. Encounter for supervision of normal intrauterine pregnancy in multigravida, antepartum -per pt report- had pre E teaching  3. AMA (38 at delivery) -has appt on 1/26  4. Obesity in pregnancy Pregravid BMI=39 Size> dates -has appt 1/26 for US  -11 lb (-4.99 kg) -weekly NSTs to start at 37 weeks (scheduled)  5. Tobacco use during pregnancy, antepartum 1/2 ppd- does not want to quit at this time   6. Hx of preterm delivery, currently pregnant -has follow up appts with MFM  7. Reactive airway disease without complication, unspecified asthma severity, unspecified whether persistent -reports she is sometimes using SABA every day -on a 5 day course of prednisone - reports she got it at an UC a few weeks ago- but didn't start it at that time -states she is taking it now and has 2 days left- feels this is helping -I explained that symbicort  is an inhaled corticosteroid- and typically if someone is using a rescue inhaler 2x/week they need some daily medication -she reports frustration with pulmonology because she believes they aren't accounting for her pregnancy as exasperating her symptoms- continues to decline to use symbicort     Preterm labor symptoms and general obstetric  precautions including but not limited to vaginal bleeding, contractions, leaking of fluid and fetal movement were reviewed in detail with the patient. Please refer to After Visit Summary for other counseling recommendations.  Return in about 2 weeks (around 04/18/2024) for  Routine Prenatal Care.  Future Appointments  Date Time Provider Department Center  04/18/2024  4:00 PM [facility] US  1 [facility] [place]  04/19/2024 10:00 AM [facility] PROVIDER AC-[facility] None  04/25/2024  9:00 AM [facility] NST [facility] [place]  05/02/2024  9:00 AM [facility] NST [facility] [place]  05/09/2024  9:00 AM [facility] NST [facility] [place]  05/16/2024  9:00 AM [facility] US  1 [facility] [place]    Verneta Bers, FNP  "

## 2024-04-05 NOTE — Addendum Note (Signed)
 Addended by: Anay Rathe J on: 04/05/2024 11:43 AM   Modules accepted: Orders

## 2024-04-05 NOTE — Assessment & Plan Note (Signed)
 Doing well. BP normal. TWG -11 lb 6.4 oz (-5.171 kg). Taking prenatal vitamin daily. Next prenatal appointment in 2 weeks.   Discussed or disclosed in AVS: - Breast feeding support through [facility] breast feeding peer counselor program - Mood resources, including 988, Maternal Mental Health Line, and counselor Alan Hail, [place], here at [facility] - Prenatal classes

## 2024-04-07 ENCOUNTER — Inpatient Hospital Stay

## 2024-04-07 ENCOUNTER — Encounter: Payer: Self-pay | Admitting: Obstetrics and Gynecology

## 2024-04-07 ENCOUNTER — Other Ambulatory Visit: Payer: Self-pay

## 2024-04-07 ENCOUNTER — Observation Stay
Admission: EM | Admit: 2024-04-07 | Discharge: 2024-04-07 | Discharge status: Against Medical Advice | Attending: Obstetrics and Gynecology | Admitting: Obstetrics and Gynecology

## 2024-04-07 DIAGNOSIS — F17210 Nicotine dependence, cigarettes, uncomplicated: Secondary | ICD-10-CM | POA: Diagnosis not present

## 2024-04-07 DIAGNOSIS — O4193X0 Disorder of amniotic fluid and membranes, unspecified, third trimester, not applicable or unspecified: Secondary | ICD-10-CM | POA: Diagnosis present

## 2024-04-07 DIAGNOSIS — Z3480 Encounter for supervision of other normal pregnancy, unspecified trimester: Principal | ICD-10-CM

## 2024-04-07 DIAGNOSIS — O42913 Preterm premature rupture of membranes, unspecified as to length of time between rupture and onset of labor, third trimester: Secondary | ICD-10-CM | POA: Diagnosis not present

## 2024-04-07 DIAGNOSIS — O99333 Smoking (tobacco) complicating pregnancy, third trimester: Secondary | ICD-10-CM | POA: Insufficient documentation

## 2024-04-07 DIAGNOSIS — O4103X0 Oligohydramnios, third trimester, not applicable or unspecified: Secondary | ICD-10-CM | POA: Diagnosis not present

## 2024-04-07 DIAGNOSIS — O42919 Preterm premature rupture of membranes, unspecified as to length of time between rupture and onset of labor, unspecified trimester: Secondary | ICD-10-CM | POA: Diagnosis present

## 2024-04-07 DIAGNOSIS — Z3A32 32 weeks gestation of pregnancy: Secondary | ICD-10-CM | POA: Insufficient documentation

## 2024-04-07 LAB — RUPTURE OF MEMBRANE (ROM)PLUS: Rom Plus: POSITIVE

## 2024-04-07 MED ORDER — LACTATED RINGERS IV SOLN: INTRAVENOUS | Status: DC

## 2024-04-07 MED ORDER — MAGNESIUM SULFATE BOLUS VIA INFUSION
4.0000 g | Freq: Once | INTRAVENOUS | Status: DC
  Filled 2024-04-07: qty 1000

## 2024-04-07 MED ORDER — SODIUM CHLORIDE 0.9 % IV SOLN
2.0000 g | Freq: Four times a day (QID) | INTRAVENOUS | Status: DC
  Filled 2024-04-07: qty 2000

## 2024-04-07 MED ORDER — AMOXICILLIN 500 MG PO CAPS: 500.0000 mg | ORAL_CAPSULE | Freq: Three times a day (TID) | ORAL | Status: DC

## 2024-04-07 MED ORDER — ACETAMINOPHEN 325 MG PO TABS: 650.0000 mg | ORAL_TABLET | ORAL | Status: DC | PRN

## 2024-04-07 MED ORDER — BETAMETHASONE SOD PHOS & ACET 6 (3-3) MG/ML IJ SUSP
12.0000 mg | INTRAMUSCULAR | Status: DC
  Filled 2024-04-07: qty 5

## 2024-04-07 MED ORDER — MAGNESIUM SULFATE 40 GM/1000ML IV SOLN: 2.0000 g/h | INTRAVENOUS | Status: DC

## 2024-04-07 MED ORDER — AZITHROMYCIN 500 MG PO TABS
1000.0000 mg | ORAL_TABLET | Freq: Once | ORAL | Status: DC
  Filled 2024-04-07: qty 2

## 2024-04-07 NOTE — H&P (Signed)
 ------------------------------------------------------------------------------- Attestation signed by Cline Jenkins Setter, MD at 04/07/24 1447 I discussed the plan of care with Dr. Melson. I agree with her assessment and plan. Patient admitted for inpatient expectant management of PPROM. Annette Dominique, MD -------------------------------------------------------------------------------  Obstetrics Antepartum History & Physical  ASSESSMENT AND PLAN   Annette Cline is a 39 y.o. [id number] at [redacted]w[redacted]d by 21 week ultrasound, who is admitted forPPROM.  PPROM  Oligohydramnios  - Initially presented to [facility] due to LoF since 1/15 at 0030. Was grossly ruptured and triple positive on their exam. OSH US  Report shows AFI 2.8 cm consistent with PPROM. Left AMA due to disagreement with RN staff.  - Represented to [facility] where she was again confirmed to be triple positive (Nitrazine, ferning, and pooling). Based on this recommended, latency antibiotics, BMZ for lung maturity, and inpatient admission until 34 weeks with IoL at that time.  >> Ordered CBC, CMP, Vaginitis, GBS, G/C, and urine culture >> Plan to admit to inpatient until 34 weeks with plan for latency antibiotics and BMZ >> Can transition back to FM at time of IoL if determined.    >> Repeat US  ordered.   Tobacco Use Disorder - 0.5 pack of cigarettes daily.  - Ordered for nicotine  patch and gum PRN while hospitalized.   Grand parity - [id number] - Admit Hgb pending  History of Laparoscopic Appendectomy - 01/15/2024  Obesity  - BMI 38 - SCDs intrapartum; Consider LVX pp  Reactive airway disease -Albuterol  inhaler ordered  FWB: [cephalic] - daily  - Last growth: EFW 1497g/(33%) on 12/29 at [redacted]w[redacted]d, right lateral placenta - Betamethasone : 1/15-16 - GBS: collected and pending  MOD: Anticipate vaginal delivery Consented for C/S on admission for fetal distress and to include b/l salpingectomy. Previously signed medicaid consents.  Will  work on locating vs signing new forms for our records.   Antenatal Care: [Provider] - pending / Rubella Immune / Varicella pend / TDaP declined previously  - Plans to breastfeed - Contraception: plans Postpartum BTL   Prophylaxis: OOB, SCDs if non-ambulatory  Disposition: floor status   Plan discussed with attending Dr. Dominique, who is in agreement.   HPI   Chief Complaint: LoF  Annette Cline is a 39 y.o. [id number] F at [redacted]w[redacted]d who reports LoF since 37 today.   Patient denies fevers, chills, abdominal pain, contractions, dFM. Patient complains of leaking of fluid.  Pregnancy Complications: Problem List[1]  Review of Systems A 12 point review of system was negative except as stated in the HPI.   Medications Prescriptions Prior to Admission[2]  Allergies has no known allergies.   OB History OB History  Gravida Para Term Preterm AB Living  10 5 1 4 4 5   SAB IAB Ectopic Molar Multiple Live Births  0 0 0 0 0 5    # Outcome Date GA Lbr Len/2nd Weight Sex Type Anes PTL Lv  10 Current           9 AB           8 AB           7 AB           6 AB           5 Preterm           4 Preterm           3 Preterm           2 Preterm  1 Term             GYN History History of STIs: HPV  Past Medical History Past Medical History[3]  Past Surgical History Past Surgical History[4]  Social History     Family History family history is not on file.   PHYSICAL EXAM   Vitals:   04/07/24 0915 04/07/24 0930  BP:  105/63  Pulse:  79  Resp:  18  Temp:  36.8 C (98.2 F)  TempSrc:  Oral  SpO2:  99%  Weight: (!) 102.7 kg (226 lb 8 oz)   Height: 165.1 cm (5' 5)    Constitutional: No acute distress.  Neurological: She is alert and oriented to person, place, and time.  Psychiatric: She has a normal mood and affect.  Cardiovascular: Normal rate.   Pulmonary/Chest: Normal work of breathing.  Abdominal: Soft, gravid, nontender.  Skin: Skin is warm and dry. No  rash noted.  Genitourinary: Normal external female genitalia.  SSE: see FM note for details (+pooling & visually not dilated)  SVE: Dilation: Fingertip Effacement (%): 0 Station: Floating OB Examiner: charleen mark MD  Presentation: cephalic by US  EFW: 497g/(33%) on 12/29 at [redacted]w[redacted]d   NST Interpretation Indication: see HPI Baseline: 150 bpm Variability: moderate Accelerations: present Decelerations: Variable: x1 Contractions: none Time noted:  See OBIX Impression: reactive Authenticated by: Berwyn DELENA Galli, MD   PRENATAL AND ENCOUNTER LABS   Prenatal Results     1st Trimester     Test Value Reference Range Date Time   ABO Rh       Antibody Screen       HCT       HGB       MCV       Platelets       Rubella IGG       RPR       Treponemal Antibody       Urine Culture       HBsAg       HIV       HCV Ab       Chlamydia       Gonorrhea       Trichomonas Screen       HSV 1 IGG       HSV 2 IGG       VZV IGG       TSH       HPV       Pap Smear       Pap Smear ([facility])       Early Glucose       Hemoglobin A1C       GBS - Early Screen       GBS w/ Susceptibility - Early Screen             HELLP Labs     Test Value Reference Range Date Time   HCT       HGB       MCV       PLT       AST       ALT       Creatinine       Creatinine Urine       Protein/Creatinine Ratio        Protein Urine       LDC       Uric Acid             2nd Trimester     Test Value  Reference Range Date Time   Antibody Screen       HCT       HGB       Platelets       RPR       Treponemal Antibody       HIV       Glucose, Fasting       [place]ullivan Glucose 1 Hour       Glucose, 1 hour tolerance       Glucose, 2 hour tolerance       Glucose, 3 hour tolerance       POC Glucose       POC Glucose, 1 hour       POC Glucose, 2 hour       POC Glucose, 3 hour             3rd Trimester     Test Value Reference Range Date Time   HCT       HGB       Platelets       GBS  Sen       GBS       Chlamydia       Gonorrhea       RPR       Treponemal Antibody       HIV       Hemoglobin A1C             Legend   ^: Historical                 Encounter Labs: No results found for this visit on 04/07/24.        [1] Patient Active Problem List Diagnosis   Preterm premature rupture of membranes (PPROM) with unknown onset of labor ([facility]HS-HCC)  [2] No medications prior to admission.  [3] No past medical history on file. [4] No past surgical history on file.

## 2024-04-07 NOTE — OB Triage Note (Signed)
 This RN went in to administer medications prescribed by JINNY Coe CNM. Pt requested that she go outside to smoke. RN stated that she would consult with CNM. CNM stated that it is against policy for the pt to leave the floor. This RN explained this to the pt, who then decided that she wanted to leave AMA.  Risks were discussed with the pt, pt decided to move forward with AMA departure. AMA paperwork provided to the pt with signature obtained.  Pt left L/D triage stable and ambulatory with her significant other.

## 2024-04-07 NOTE — Nursing Note (Signed)
 Pt transferred to 3W04. Pt a/ox4 and stable at this time. Bed low. Wheels locked. Call bell in reach. Pt updated on plan of care and deny questions or concerns at this time. Pt oriented to room and informed about call bell / when to use call bell / when to use emergency alarms in pt room.

## 2024-04-07 NOTE — H&P (Signed)
 HISTORY AND PHYSICAL NOTE  History of Present Illness: Annette Cline is a 39 y.o. [name] at [redacted]w[redacted]d admitted for pPROM.  She presented to L&D with complaints of leaking fluid.    Reports active fetal movement  Contractions: denies  LOF/SROM: around midnight Vaginal bleeding: denies Fetal presentation is cephalic.  Factors complicating pregnancy:  Tobacco use in pregnancy Reactive airway disease Obesity Limited prenatal care H/o preterm delivery (36w, 36w, 30w) Grand Multiparity AMA  Prenatal care site:  [facility]  Patient Active Problem List   Diagnosis Date Noted   Grand multiparity 03/21/2024   Limited prenatal care in third trimester 03/01/2024   AMA (38 at delivery) 01/26/2024   History of laparoscopic appendectomy 01/15/2024   Encounter for supervision of normal intrauterine pregnancy in multigravida, antepartum 01/15/2024   Tobacco use during pregnancy, antepartum 01/15/2024   Obesity in pregnancy Pregravid BMI=39 01/15/2024   Hx of abnormal cervical Pap smear 01/15/2024   Hx of preterm delivery, currently pregnant 01/15/2024   Reactive airway disease 01/15/2024   Left ovarian cyst 08/02/2018   CIN III (cervical intraepithelial neoplasia grade III) with severe dysplasia 08/02/2018    Past Medical History:  Diagnosis Date   Abnormal cervical Pap smear with positive HPV DNA test    HPV   Acute appendicitis    Carpal tunnel syndrome    Dyspnea    Hx of tonsillectomy 01/15/2024   No prenatal care in current pregnancy in third trimester 09/08/2020   Preterm labor    Reactive airway disease 01/15/2024   W mild activity and at night Pulm appt at [facility] on 02/25/24: Symbicort  for asthma, smoking cessation (declined)     Past Surgical History:  Procedure Laterality Date   CERVICAL CONIZATION W/BX N/A 04/17/2015   Procedure: CONIZATION CERVIX WITH BIOPSY;  Surgeon: Mitzie BROCKS Ward, MD;  Location: [facility] ORS;  Service: Gynecology;  Laterality: N/A;   LAPAROSCOPIC  APPENDECTOMY N/A 09/19/2016   Procedure: APPENDECTOMY LAPAROSCOPIC;  Surgeon: Desiderio Schanz, MD;  Location: [facility] ORS;  Service: General;  Laterality: N/A;   TONSILLECTOMY      OB History  Gravida Para Term Preterm AB Living  10 5 2 3 4 5   SAB IAB Ectopic Multiple Live Births  2 2 0 0 5    # Outcome Date GA Lbr Len/2nd Weight Sex Type Anes PTL Lv  10 Current           9 Preterm 09/18/20 [redacted]w[redacted]d  1417 g M Vag-Spont EPI Y LIV  8 Preterm 02/06/16 [redacted]w[redacted]d / 00:05 2820 g M Vag-Spont None  LIV  7 Term 02/07/15 [redacted]w[redacted]d  2930 g F Vag-Spont None  LIV  6 Preterm 03/12/11 [redacted]w[redacted]d  2438 g M Vag-Spont   LIV  5 Term 08/26/01 [redacted]w[redacted]d  2948 g F Vag-Spont   LIV  4 IAB           3 IAB           2 SAB           1 SAB             Social History:  reports that she has been smoking cigarettes. She has never been exposed to tobacco smoke. She has never used smokeless tobacco. She reports that she does not currently use alcohol. She reports that she does not use drugs.  Family History: family history includes Cancer in her maternal aunt and maternal grandmother; Diabetes in her mother; Kidney disease in her mother; Scoliosis in her brother.  Allergies[1]  Medications Prior to Admission  Medication Sig Dispense Refill Last Dose/Taking   predniSONE  (DELTASONE ) 10 MG tablet Take 10 mg by mouth daily with breakfast.   04/06/2024 Morning   Prenatal Vit-Fe Fumarate-FA (MULTIVITAMIN-PRENATAL) 27-0.8 MG TABS tablet Take 1 tablet by mouth daily at 12 noon.   04/07/2024 Morning   albuterol  (VENTOLIN  HFA) 108 (90 Base) MCG/ACT inhaler Inhale 2 puffs into the lungs every 4 (four) hours as needed for wheezing or shortness of breath. (Patient not taking: Reported on 04/07/2024) 1 each 1 Not Taking   aspirin  EC 81 MG tablet Take 1 tablet (81 mg total) by mouth daily. Swallow whole. (Patient not taking: Reported on 03/10/2024)   Not Taking   budesonide -formoterol  (SYMBICORT ) 80-4.5 MCG/ACT inhaler Inhale 2 puffs into the lungs 2 (two)  times daily. (Patient not taking: Reported on 04/04/2024)       ROS  Physical Examination: Vitals:  BP 112/63   Pulse 78   Temp 97.6 F (36.4 C)   Ht 5' 5 (1.651 m)   Wt 103.9 kg   LMP 07/20/2023 (Exact Date)   BMI 38.11 kg/m  General: no acute distress.  HEENT: normocephalic, atraumatic Heart: regular rate & rhythm.  No murmurs/rubs/gallops Lungs: clear to auscultation bilaterally, normal respiratory effort Abdomen: soft, gravid, non-tender;  EFW: 03/21/24 1497g = 33% Pelvic:   External: Normal external female genitalia  Cervix:   /   /      Extremities: non-tender, symmetric, no edema bilaterally.   Neurologic: Alert & oriented x 3.    Membranes: ruptured, clear fluid UC:   none  Labs:  Results for orders placed or performed during the hospital encounter of 04/07/24 (from the past 24 hours)  Rupture of Membrane (ROM) Plus   Collection Time: 04/07/24  3:03 AM  Result Value Ref Range   Rom Plus POSITIVE     Prenatal Labs: Blood type/Rh A pos  Antibody screen neg  Rubella Immune  Varicella Immune  RPR NR  HBsAg Neg  Hep C NR  HIV NR  GC neg  Chlamydia neg  Genetic screening cfDNA negative   1 hour GTT 94  3 hour GTT   GBS pending    Imaging Studies: US  MFM OB DETAIL +14 WK Result Date: 03/21/2024 ----------------------------------------------------------------------  OBSTETRICS REPORT                        (Signed Final 03/21/2024 01:07 pm) ---------------------------------------------------------------------- Patient Info  ID #:       [id number]                          D.O.B.:  [birth year] (38 yrs)(F)  Name:       Annette Cline                 Visit Date: 03/21/2024 07:55 am ---------------------------------------------------------------------- Performed By  Attending:        Nathanel Fetters      Ref. Address:      [address]                    [place]  [address]                                                               [place]                                                              [address]  Performed By:     Erminio Gentry            [facility]          [facility]                                      [facility] at                                                              [facility]  Referred By:      [facility]                    Department ---------------------------------------------------------------------- Orders  #  Description                           Code        Ordered By  1  US  MFM OB DETAIL +14 WK               76811.01    CATHERINE LYNN ----------------------------------------------------------------------  #  Order #                     Accession #                Episode #  1  [id number]                   [id number]                 [id number] ---------------------------------------------------------------------- Indications  Poor obstetric history: Previous preterm        O09.219  delivery, antepartum (30, 36w)  Advanced maternal age multigravida 63+,         O2.523  third trimester (38)  Obesity complicating pregnancy, third           O99.213  trimester (40)  Late to prenatal care, third trimester          O09.33  Tobacco use complicating pregnancy, third       O99.333  trimester  [redacted] weeks gestation of pregnancy  Z3A.30  Encounter for antenatal screening for           Z36.3  malformations  LR NIPS ---------------------------------------------------------------------- Fetal Evaluation  Num Of Fetuses:          1  Fetal Heart Rate(bpm):   145  Cardiac Activity:        Observed  Presentation:            Cephalic  Placenta:                Right lateral  P. Cord Insertion:       Visualized, central  Amniotic Fluid  AFI FV:      Within normal limits  AFI Sum(cm)     %Tile       Largest Pocket(cm)  15.09           53          6.26  RUQ(cm)       RLQ(cm)       LUQ(cm)        LLQ(cm)  6.26          4.34           0.9            3.59 ---------------------------------------------------------------------- Biometry  BPD:     72.76  mm     G. Age:  29w 1d         13  %    CI:        73.48   %    70 - 86                                                          FL/HC:       20.5  %    19.2 - 21.4  HC:      269.7  mm     G. Age:  29w 3d          6  %    HC/AC:       1.01       0.99 - 1.21  AC:    267.13   mm     G. Age:  30w 6d         66  %    FL/BPD:      75.9  %    71 - 87  FL:      55.26  mm     G. Age:  29w 1d         12  %    FL/AC:       20.7  %    20 - 24  HUM:      50.8  mm     G. Age:  29w 5d         45  %  CER:      38.8  mm     G. Age:  31w 5d         76  %  LV:        2.7  mm  CM:        4.2  mm  Est. FW:    1497   gm     3 lb 5 oz     33  % ---------------------------------------------------------------------- OB History  Gravidity:    10        Term:   2        Prem:   3        SAB:   2  TOP:          2         Living: 5 ---------------------------------------------------------------------- Gestational Age  LMP:           35w 0d        Date:  07/20/23                  EDD:   04/25/24  U/S Today:     29w 5d                                        EDD:   06/01/24  Best:          30w 1d     Det. By:  Early Ultrasound         EDD:   05/29/24                                      (01/22/24) ---------------------------------------------------------------------- Targeted Anatomy  Central Nervous System  Calvarium/Cranial V.:  Appears normal         Cereb./Vermis:          Appears normal  Cavum:                 Appears normal         Cisterna Magna:         Appears normal  Lateral Ventricles:    Appears normal         Midline Falx:           Appears normal  Choroid Plexus:        Appears normal  Spine  Cervical:              Appears normal         Sacral:                 Appears normal  Thoracic:              Appears normal         Shape/Curvature:        Appears normal  Lumbar:                Appears normal  Head/Neck  Lips:                   Appears normal         Profile:                Appears normal  Neck:                  Appears normal         Orbits/Eyes:            Appears normal  Nuchal Fold:           Not applicable         Mandible:               Not well visualized  Nasal Bone:            Present  Maxilla:                Not well visualized  Thorax  4 Chamber View:        Appears normal         Interventr. Septum:     Appears normal  Cardiac Rhythm:        Normal                 Cardiac Axis:           Normal  Cardiac Situs:         Appears normal         Diaphragm:              Appears normal  Rt Outflow Tract:      Not well visualized    3 Vessel View:          Appears normal  Lt Outflow Tract:      Not well visualized    3 V Trachea View:       Not well visualized  Aortic Arch:           Not well visualized    IVC:                    Appears normal  Ductal Arch:           Not well visualized    Crossing:               Not well visualized  SVC:                   Appears normal  Abdomen  Ventral Wall:          Appears normal         Lt Kidney:              Appears normal  Cord Insertion:        Appears normal         Rt Kidney:              Appears normal  Situs:                 Appears normal         Bladder:                Appears normal  Stomach:               Appears normal  Extremities  Lt Humerus:            Visualized             Lt Femur:               Appears normal  Rt Humerus:            Appears normal         Rt Femur:               Appears normal  Lt Forearm:            Visualized             Lt Lower Leg:           Appears normal  Rt Forearm:            Appears normal         Rt Lower Leg:           Appears normal  Lt Hand:  Open hand nml          Lt Foot:                Nml heel/foot  Rt Hand:               Open hand nml          Rt Foot:                Nml heel/foot  Other  Umbilical Cord:        Normal 3-vessel        Genitalia:              Female-nml  ---------------------------------------------------------------------- Cervix Uterus Adnexa  Cervix  Not visualized (advanced GA >24wks)  Uterus  No abnormality visualized.  Right Ovary  Size(cm)     3.19   x   2.12   x  2.1       Vol(ml):  7.44  Within normal limits.  Left Ovary  Size(cm)     2.34   x   1.33   x  1.4       Vol(ml):  2.28  Within normal limits.  Cul De Sac  No free fluid seen.  Adnexa  No abnormality visualized ---------------------------------------------------------------------- Impression  Single intrauterine pregnancy here for a detailed anatomy  due to elevated BMI and advanced maternal age.  Normal anatomy with measurements consistent with dates  There is good fetal movement and amniotic fluid volume  Suboptimal views of the fetal anatomy were obtained  secondary to fetal position. ---------------------------------------------------------------------- Recommendations  Follow up growth in 4 weeks to complete the fetal anatomy ----------------------------------------------------------------------               Nathanel Fetters, MD Electronically Signed Final Report   03/21/2024 01:07 pm ----------------------------------------------------------------------    Assessment and Plan: Patient Active Problem List   Diagnosis Date Noted   Grand multiparity 03/21/2024   Limited prenatal care in third trimester 03/01/2024   AMA (38 at delivery) 01/26/2024   History of laparoscopic appendectomy 01/15/2024   Encounter for supervision of normal intrauterine pregnancy in multigravida, antepartum 01/15/2024   Tobacco use during pregnancy, antepartum 01/15/2024   Obesity in pregnancy Pregravid BMI=39 01/15/2024   Hx of abnormal cervical Pap smear 01/15/2024   Hx of preterm delivery, currently pregnant 01/15/2024   Reactive airway disease 01/15/2024   Left ovarian cyst 08/02/2018   CIN III (cervical intraepithelial neoplasia grade III) with severe dysplasia 08/02/2018    1. Admit to  Antenatal -- Dating by 01/22/24 u/s at [redacted]w[redacted]d 03/21/24 u/s also confirmed this dating -- H/o of PTD (36w x2 and 30w), AMA, smoker, BMI 38 Medications --- Ampicillin   2g q6hr --- Azithromycin  1000mg  once --- BMZ --- Mag Sulfate - refused by patient Labs --- GBS pending --- UDS pending --- CBC, T&S, RPR, GC/CT, wet prep Ultrasound --- 1/15 vtx, AFI 2.8cm  Left AMA because she couldn't leave the floor to smoke  Annette Cline, CNM  Certified Nurse Midwife [facility]        [1]  Allergies Allergen Reactions   Ciprofloxacin Hives and Palpitations   Sulfa Antibiotics Rash and Hives

## 2024-04-07 NOTE — Care Plan (Signed)
 Shift Summary Azithromycin  was administered in the afternoon for infection prophylaxis related to PPROM.  Vaginal fluid amount decreased from moderate to scant, and color remained clear during the shift.  Pain remained at 0 and no new discomfort or psychosocial concerns were reported.  Laboratory results including CBC, metabolic panel, and vaginitis panel were reviewed and did not prompt additional interventions.  Overall, safety was maintained, and no new complications or hospital-acquired issues were documented.   Absence of Hospital-Acquired Illness or Injury: No new hospital-acquired injuries or illnesses were documented during the shift; safety interventions such as bed in low position, side rails up, and family presence were consistently maintained, and the environment remained safe throughout. Intermittent pneumatic compression devices were ordered but not used due to ambulation, and skin protection measures were observed.   Optimal Comfort and Wellbeing: Pain remained at 0 throughout the shift without need for intervention, and no epigastric pain was reported. Psychosocial status was within defined limits, and no recent unintentional weight loss or cultural requests were noted.   Optimal Patient-Fetal Wellbeing: PPROM status was consistently documented, with clear and decreasing vaginal fluid noted and no active bleeding reported; preeclampsia assessment and genitourinary status were within defined limits. Laboratory results including CBC, metabolic panel, and vaginitis panel were reviewed, and azithromycin  was administered.

## 2024-04-07 NOTE — Discharge Summary (Signed)
 Patient ID: Annette Cline MRN: [id number] DOB/AGE: 04-29-1985 39 y.o.  Admit date: 04/07/2024 Discharge date: 04/07/2024  Admission Diagnoses: 39 y.o. [name] at [redacted]w[redacted]d present for leaking fluid  Discharge Diagnoses:  Labor: not present.  pPROM Oligo Fetal Wellbeing: NST is Reassuring Cat 1 tracing. Left AMA  Factors complicating pregnancy: Tobacco use in pregnancy Reactive airway disease Obesity Limited prenatal care H/o preterm delivery (36w, 36w, 30w) Grand Multiparity AMA  Prenatal Procedures: NST and ultrasound  Consults: Neonatology - was not able to get the consult before she left  Significant Diagnostic Studies:  Results for orders placed or performed during the hospital encounter of 04/07/24 (from the past week)  Rupture of Membrane (ROM) Plus   Collection Time: 04/07/24  3:03 AM  Result Value Ref Range   Rom Plus POSITIVE     Treatments: none  Hospital Course:  This is a 39 y.o. [name] with [name] at [redacted]w[redacted]d seen for leaking fluid.  U/s showed vtx and oligo.  Dr. Verdon notified -   BMZ, Mag sulfate (pt refused), antibiotics ordered along with labs. Before an IV could be placed, medications could be given, or blood work collected she left AMA.   Discharge Physical Exam:  BP 112/63   Pulse 78   Temp 97.6 F (36.4 C)   Ht 5' 5 (1.651 m)   Wt 103.9 kg   LMP 07/20/2023 (Exact Date)   BMI 38.11 kg/m   General: NAD CV: RRR Pulm: nl effort ABD: s/nd/nt, gravid DVT Evaluation: LE non-ttp, no evidence of DVT on exam.  NST: FHR baseline: 145 bpm Variability: moderate Accelerations: yes  10x10 Decelerations: occasional Category/reactivity: equivocal   TOCO: quiet SVE: deferred      Discharge Condition: Stable  Disposition: Discharge disposition: 01-Home or Self Care        Allergies as of 04/07/2024       Reactions   Ciprofloxacin Hives, Palpitations   Sulfa Antibiotics Rash, Hives        Medication List     TAKE these  medications    albuterol  108 (90 Base) MCG/ACT inhaler Commonly known as: VENTOLIN  HFA Inhale 2 puffs into the lungs every 4 (four) hours as needed for wheezing or shortness of breath.   aspirin  EC 81 MG tablet Take 1 tablet (81 mg total) by mouth daily. Swallow whole.   budesonide -formoterol  80-4.5 MCG/ACT inhaler Commonly known as: SYMBICORT  Inhale 2 puffs into the lungs 2 (two) times daily.   multivitamin-prenatal 27-0.8 MG Tabs tablet Take 1 tablet by mouth daily at 12 noon.   predniSONE  10 MG tablet Commonly known as: DELTASONE  Take 10 mg by mouth daily with breakfast.         Signed:  DELON COE, CNM 04/07/2024 7:02 AM

## 2024-04-07 NOTE — BH Treatment Plan (Signed)
 Presented to bedside to consent patient for bilateral salpingectomy.   She is aware this is a permanent procedure and reports she never wants to be pregnant again. She is also aware this will not stop menstruation or push her into menopause.  She is aware that a desire for future pregnancy after a bilateral salpingectomy would require IVF, which for many patients can be cost prohibitive. She agrees to proceed. We discussed bilateral tubal ligation vs bilateral salpingectomy as an opportunistic risk reduction for ovarian cancer as well as a means of reducing the risk of contraceptive failure and ectopic pregnancy.   Patient is determining if she would like an internal bilateral salpingectomy vs a postpartum bilateral  tubal ligation. Process was explained for both. Additionally, if a Cesarean section, patient desires bilateral salpingectomy.   Importantly, the electronic medical record indicates she signed consents on 03/01/2024 but we are resigning medicaid consents today as I cannot see those records. These will be scanned into the patient chart.  Berwyn Galli, MD Resident/PGY3 Department of Obstetrics and Gynecology [facility]  at [facility]

## 2024-04-07 NOTE — OB Triage Note (Signed)
 Pt is a G10P5 at [redacted]w[redacted]d gestation reporting to L/D triage due to experiencing a gush of fluid while sleeping around 1230. She reports that she got up to shower prior to coming to the hospital & was still experiencing the leaking. Since arriving to the hospital, pt states that she has no longer noticed LOF. Pt reports that she is 33-34 weeks. She reports her LMP is 07/20/2023. Pt reports her due date is 05/29/24. She reports no vaginal bleeding, no ctx present, and reports +FM. She states last sexual intercourse was a week ago. She endorses smoking 0.5 pack of cigarettes daily.  VSS Monitors applied and assessing initial fht 140 at 0242. Annette Cline CNM aware of pt arrival to the unit.  ROM plus collected and sent to lab for testing.

## 2024-04-08 NOTE — Progress Notes (Signed)
 ------------------------------------------------------------------------------- Attestation signed by Vora, Neeta Lakshmi, MD at 04/08/24 [phone] Attending Provider Note I saw and evaluated the patient, participating in the key portions of the service.  I reviewed the residents note.  I agree with the residents findings and plan.   I personally spent 30 minutes on the floor or unit in direct patient care. The direct patient care time included face-to-face time with the patient, reviewing the patient's chart, communicating with the family and/or other professionals and coordinating care. Greater than 50% of this time was spent in counseling or coordinating care with the patient regarding the issues described above.   Graciela LITTIE Acosta, MD   -------------------------------------------------------------------------------  Antepartum Daily Progress Note  ASSESSMENT AND PLAN   Hospital Day: 2   Annette Cline is a 39 y.o. [id number] at [redacted]w[redacted]d, admitted for PPROM on 1/15 at [redacted]w[redacted]d.   PPROM  Oligohydramnios  - Initially presented to [facility] due to LoF since 1/15 at 0030. Was grossly ruptured and triple positive on their exam. OSH US  Report shows AFI 2.8 cm consistent with PPROM. Left AMA due to disagreement with RN staff.  - Represented to [facility] where she was again confirmed to be triple positive (Nitrazine, ferning, and pooling). Based on this recommended, latency antibiotics, BMZ for lung maturity, and inpatient admission until 34 weeks with IoL at that time. - Admit CBC nml (WBC 9.4), cmp nml, vaginitis and urine cx neg - gbs, gc/ct pending (1/15) - BMZ 1/15-16 - Latency abx 1/15-22   >> Continue expectant management until 34 weeks unless additional complications arise. >> Can transition back to FM at time of IoL if determined.      Tobacco Use Disorder - 0.5 pack of cigarettes daily.  - Ordered for nicotine  patch and gum PRN while hospitalized.    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 Signed medicaid consents with previous OBGYN, resigned 1/15 on admission.  PNC: [employer] [facility] (FM) A POS / RI / VI / Tdap declined previously / COV will assess / Flu will assess / RSV will assess Contraception: tubal ligation / Feeding: Breast  VTE Prophylaxis: SCDs in bed  Dispo: Inpatient management to delivery  Patient seen and plan discussed with Dr. Vora.  SUBJECTIVE   Had some leg pain last night, suspect positional. Still having some leaking. Denies fever, chills. She denies regular contractions, bleeding. Fetal movement: normal    OBJECTIVE  Vital signs in last 24 hours Temp:  [36.4 C (97.6 F)-37 C (98.6 F)] 36.7 C (98 F) Pulse:  [61-107] 77 Resp:  [16-18] 18 BP: (100-117)/(49-60) 117/56 MAP (mmHg):  [60-72] 72 SpO2:  [100 %] 100 %  Intake/Output last 24 hours I/O last 3 completed shifts: In: 323.3 [IV Piggyback:323.3] Out: -  No intake/output data recorded.  Physical Exam General: No acute distress Abdomen: Soft, gravid, non-tender. No rebound/guarding. Genitourinary: deferred Musculoskeletal: Grossly normal ROM. Extremities: Warm and well-perfused.  Trace edema.  Neurological: Alert and conversational.  Psychological: Appropriate mood/affect.    Data Review Lab results  last 24 hours:  No results found for this or any previous visit (from the past 24 hours).

## 2024-04-08 NOTE — Care Plan (Signed)
------------------------------------------------------------------------------- °  Summary: Shift Summary -------------------------------------------------------------------------------  Shift Summary Patient reports improving respiratory discomfort after rupture of membrane. Slight wheezing was heard in the right lung on assessment but albuterol  PRN for wheezing was refused at that time. No vaginal discharge was noted and vaginal drainage remained clear and small. Signs and symptoms of infection were reiterated. Around 0300, right-sided abdominal cramping was reported. Hydration, voiding, and side-lying interventions were emphasized and Angel was resting later in the shift.

## 2024-04-08 NOTE — Procedures (Signed)
"    VENOUS ACCESS ULTRASOUND PROCEDURE NOTE  Indications:   Poor venous access.  The Venous Access Team has assessed this patient for the placement of a PIV. Ultrasound guidance was necessary to obtain access.   Procedure Details: Identity of the patient was confirmed via name, medical record number and date of birth. The availability of the correct equipment was verified.  The vein was identified for ultrasound catheter insertion.  Field was prepared with necessary supplies and equipment.  Probe cover and sterile gel utilized.  Insertion site was prepped with chlorhexidine  solution and allowed to dry.  The catheter extension was primed with normal saline. A(n) 22 gauge 1.75 catheter was placed in the L Forearm with 1attempt(s). See LDA for additional details.  Catheter aspirated, 3 mL blood return present. The catheter was then flushed with 10 mL of normal saline. Insertion site cleansed, and dressing applied per manufacturer guidelines. The catheter was inserted with difficulty due to poor vasculature by Legrand FORBES Devon, RN.  Primary RN was notified.   Thank you,   Legrand FORBES Devon, RN Venous Access Team  [phone]   Workup / Procedure Time:  30 minutes  See images below:  Please follow [facility] guidelines for long PIV, deep vein medication contraindications for infusates.  [email or web address] Guidelines%2FIV Administration of Non-Antineoplastic Medications via Midline Catheters%[email or web address]=%2Fsites%2FMCPharmacy%2FClinical Guidelines    "

## 2024-04-08 NOTE — Consults (Signed)
 Inpatient Tobacco Cessation Counseling Note  This medical encounter was conducted virtually using [email or web address]  TeleHealth protocols.  I have identified myself to the patient and conveyed my credentials to Ms. Ramaswamy I have explained the capabilities and limitations of telemedicine and the patient/proxy and myself both agree that it is appropriate for their current circumstances/symptoms.   Contact Information Person Contacted: Dezi Brauner Phone number: [phone]     Phone Outcome: spoke with pt Is there someone else in the room? No.   Patient's location at the time of the telephone visit: Hospitalized at [facility]  Provider's location at the time of the telephone visit: At home, in [place]     Purpose of contact:    Pt participated in a telephone visit for tobacco cessation counseling.  Patient was admitted to hospital for PPROM at [redacted] weeks gestation. Patient consented to telephone visit given due to social isolation measures in place due to the COVID-19 pandemic.   Tobacco Use History and Assessment Time Since Last Tobacco Use: 1 to 7 days ago Tobacco Withdrawal (Past 24 Hours): None noted Type of Tobacco Products Used: Cigarettes Quantity Used: 10 Quantity Per: day Medications Used in Past Attempts: Nicotine  Patch, Nicotine  Gum  Behavioral Assessment Why Uses: 1. habit 2. stress-relief Reasons to Become Tobacco Free: health Barriers/Challenges: 1. long-standing habit 2. stress 3. pt is not motivated to quit Strategies: pt can use 1. NRT to manage cravings 2. hand-to-mouth replacements  NOTE: Pt denies having cravings to smoke and states she smokes 10cpd. Pt was polite and brief in conversation. Pt reports she is unmotivated to quit smoking and hasn't given reducing much thought. SW explained recommendation for pt to become tobacco-free in context of both recovery after childbirth and for providing a tobacco-free environment for her newborn. SW  reviewed recommendation for pt to not only avoid smoking around the infant, but to also wash her hands with soap and water and change her clothes before holding her baby, as chemicals from cigarettes transfer from our hands and clothing to the infant. Pt confirmed she originally declined having the nicotine  patch applied. SW reviewed benefits of using the patch to diminish nicotine  cravings and benefits of using nicotine  gum/lozenge to address acute cravings and the oral fixation associated with smoking. Pt agrees to having nicotine  gum added prn and states she is familiar with the chew and park process associated with the gum, as she has used it before. SW reviewed with pt that the patch is already ordered and pt can ask for the nurse to apply it. Pt agrees to prescriptions for the patch/gum to go with her at discharge, though she is undecided on continuing to use these. SW encouraged pt to consider using this admission as an opportunity to stay quit. Pt declines follow-up support. SW provided pt with contact information, physical improvements related to tobacco cessation, and available resources (including outpatient Tobacco Treatment Program at [facility] and [facility]).  Treatment Plan Please see below for medication recommendations in bold.  Cessation Meds Currently Using: None Medications Recommended During Hospitalization: Gum 4mg , Patch 7mg  Outpatient/Discharge Medications Recommended: Gum 4mg , Patch 7mg  Plan to Obtain Outpatient Meds: TTS messaged providers for Rx Patient's Plan Post Discharge/Visit: Does not plan to quit in next 6 months  As part of this Telephone Visit, no in-person exam was conducted.   I personally spent 8 minutes counseling the patient via telephone about tobacco cessation.  I spent an  additional 8 minutes on pre- and post-visit activities.    The patient was physically located in [place]  or a state in which I am permitted to provide care. The patient  and/or parent/guardian understood that s/he may incur co-pays and cost sharing, and agreed to the telemedicine visit. The visit was reasonable and appropriate under the circumstances given the patient's presentation at the time.   The patient and/or parent/guardian has been advised of the potential risks and limitations of this mode of treatment (including, but not limited to, the absence of in-person examination) and has agreed to be treated using telemedicine. The patient's/patient's family's questions regarding telemedicine have been answered.    If the visit was completed in an ambulatory setting, the patient and/or parent/guardian has also been advised to contact their providers office for worsening conditions, and seek emergency medical treatment and/or call 911 if the patient deems either necessary.   Visit Format/Coding: Telephone   Coding: 01033 (5-10 minutes) Service rendered over the phone most consistent with: Tobacco cessation counseling, greater than 3 minutes (00593)    Mliss Fair, LCSW, NCTTP Clinical Social Worker / Tobacco Treatment Specialist [facility] Phone: [phone]

## 2024-04-08 NOTE — Care Plan (Signed)
 Shift Summary Safety interventions were consistently maintained throughout the shift, including low bed position and nonskid footwear, with family present to support safety.  Pain remained at 0 and no discomfort was reported, with psychosocial status within defined limits.  Respiratory and oxygenation parameters were stable, and infection screening for chlamydia and gonorrhea was negative.  MAP fluctuated but remained within a manageable range, and preeclampsia assessment was within defined limits.  Overall, comfort and safety were maintained and no new complications were documented during the shift.  Absence of Hospital-Acquired Illness or Injury: No hospital-acquired injuries or illnesses were documented during the shift; safety interventions such as low bed position, nonskid footwear, and limited adhesive use for skin protection were consistently maintained, and family presence supported safety measures.  Optimal Comfort and Wellbeing: Pain remained at 0 throughout the shift and no epigastric pain was reported; comfort was supported by family presence and psychosocial status was within defined limits.  Optimal Patient-Fetal Wellbeing: Respiratory rate and oxygen saturation remained stable, MAP fluctuated slightly but stayed within a manageable range, and genitourinary and preeclampsia assessments were within defined limits; vaginal drainage was clear and small, and infection screening for chlamydia and gonorrhea was negative.

## 2024-04-08 NOTE — Procedures (Signed)
------------------------------------------------------------------------------- °  Attestation signed by Judge Graciela Handing, MD at 04/08/24 1034 MFM faculty  I agree with the above antenatal testing interpretation and recommendations.  Graciela Judge, MD  -------------------------------------------------------------------------------  NST Interpretation Indication: Rule out rupture of membranes: Z03.71   Baseline: 135 bpm Variability: moderate Accelerations: present Decelerations: absent Contractions: none Time noted:  See OBIX Impression: reactive Authenticated by: Ronal FORBES Heys, MD   BPP 8/10 with US  performed earlier today  Ronal FORBES Heys, MD 04/08/2024  10:14 AM

## 2024-04-09 NOTE — Care Plan (Signed)
------------------------------------------------------------------------------- °  Summary: Shift Summary -------------------------------------------------------------------------------  Shift Summary Vital signs, including temperature, remained stable and within normal limits throughout the shift.  Vaginal drainage was clear and small. Positive fetal movement without contractions was reported.  Optimal Comfort and Wellbeing: Calcium  carbonate was administered PRN for indigestion, with no further discomfort documented.  Optimal Patient-Fetal Wellbeing: Vital signs remained stable, and vaginal drainage was clear and small. Positive fetal movement without contractions.

## 2024-04-09 NOTE — Procedures (Signed)
------------------------------------------------------------------------------- °  Attestation signed by Judge Graciela Handing, MD at 04/09/24 1903 MFM faculty  I agree with the above antenatal testing interpretation and recommendations.  Graciela Judge, MD  -------------------------------------------------------------------------------  NST Interpretation Indication: Rule out rupture of membranes: Z03.71   Baseline: 135 bpm Variability: moderate Accelerations: present Decelerations: absent Contractions: none Time noted:  See OBIX Impression: reactive Authenticated by: Curtiss DELENA Ice, WHNP   Curtiss DELENA Ice, [facility] 04/09/2024  12:13 PM

## 2024-04-09 NOTE — Care Plan (Addendum)
" °  Problem: Adult Inpatient Plan of Care Goal: Absence of Hospital-Acquired Illness or Injury Outcome: Shift Focus Intervention: Identify and Manage Fall Risk Recent Flowsheet Documentation Taken 04/09/2024 1630 by Arloa Iha D, NP Safety Interventions: low bed Taken 04/09/2024 1400 by Arloa Iha BIRCH, NP Safety Interventions: low bed Taken 04/09/2024 1115 by Arloa Iha BIRCH, NP Safety Interventions: low bed Taken 04/09/2024 0920 by Arloa Iha BIRCH, NP Safety Interventions: low bed Goal: Optimal Comfort and Wellbeing Outcome: Shift Focus Goal: Readiness for Transition of Care Outcome: Shift Focus Goal: Rounds/Family Conference Outcome: Shift Focus  Fetal tracing and maternal assessment WNL. Maternal vitals remain WNL, the patient denies fundal tenderness, and endorse clear fluid will continue to monitor.  "

## 2024-04-09 NOTE — Nursing Note (Addendum)
 Care reported off to Alyssa 3WH RN.

## 2024-04-09 NOTE — Progress Notes (Signed)
 ------------------------------------------------------------------------------- Attestation signed by Vora, Neeta Lakshmi, MD at 04/09/24 1904 Attending Provider Note I saw and evaluated the patient, participating in the key portions of the service.  I reviewed the residents note.  I agree with the residents findings and plan.   I personally spent 25 minutes on the floor or unit in direct patient care. The direct patient care time included face-to-face time with the patient, reviewing the patient's chart, communicating with the family and/or other professionals and coordinating care. Greater than 50% of this time was spent in counseling or coordinating care with the patient regarding the issues described above.   Annette LITTIE Acosta, MD   -------------------------------------------------------------------------------  Antepartum Daily Progress Note  ASSESSMENT AND PLAN   Hospital Day: 3   Annette Cline is a 39 y.o. [id number] at [redacted]w[redacted]d, admitted for PPROM on 1/15 at [redacted]w[redacted]d.   PPROM  Oligohydramnios  - Initially presented to [facility] due to LoF since 1/15 at 0030. Was grossly ruptured and triple positive on their exam. OSH US  Report shows AFI 2.8 cm consistent with PPROM. Left AMA due to disagreement with RN staff.  - Represented to [facility] where she was again confirmed to be triple positive (Nitrazine, ferning, and pooling). Based on this recommended, latency antibiotics, BMZ for lung maturity, and inpatient admission until 34 weeks with IoL at that time. - Admit CBC nml (WBC 9.4), cmp nml, vaginitis and urine cx neg - gbs, gc/ct pending (1/15) - BMZ 1/15-16 - Latency abx 1/15-22   >> Continue expectant management until 34 weeks unless additional complications arise. >> Can transition back to FM at time of IoL if determined.      Tobacco Use Disorder - 0.5 pack of cigarettes daily.  - Ordered for nicotine  patch and gum PRN while hospitalized.    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 Signed medicaid consents with previous OBGYN, resigned 1/15 on admission.  PNC: [place] [facility] (FM) A POS / RI / VI / Tdap declined previously / COV will assess / Flu will assess / RSV will assess Contraception: tubal ligation / Feeding: Breast  VTE Prophylaxis: SCDs in bed  Dispo: Inpatient management to delivery  Patient was seen and plan of care was discussed with the Attending, Dr. Vora, who agrees with the above statement and plan.  Annette JONETTA Cumber, MD - PGY4 Dept of Obstetrics & Gynecology [employer]    SUBJECTIVE   Still having some leaking. Denies fever, chills. She denies regular contractions, bleeding. Fetal movement: normal    OBJECTIVE  Vital signs in last 24 hours Temp:  [36.6 C (97.8 F)-37 C (98.6 F)] 36.9 C (98.5 F) Pulse:  [58-107] 58 Resp:  [16-18] 18 BP: (97-117)/(50-59) 106/56 MAP (mmHg):  [64-72] 67 SpO2:  [98 %-100 %] 100 %  Intake/Output last 24 hours I/O last 3 completed shifts: In: 323.3 [IV Piggyback:323.3] Out: -  No intake/output data recorded.  Physical Exam General: No acute distress Abdomen: Soft, gravid, non-tender. No rebound/guarding. Genitourinary: deferred Musculoskeletal: Grossly normal  ROM. Extremities: Warm and well-perfused.  Trace edema.  Neurological: Alert and conversational.  Psychological: Appropriate mood/affect.    Data Review Lab results last 24 hours:  No results found for this or any previous visit (from the past 24  hours).

## 2024-04-10 NOTE — Care Plan (Signed)
------------------------------------------------------------------------------- °  Summary: Shift Summary -------------------------------------------------------------------------------  Shift Summary No pain was reported and no pain interventions were required during the shift. Clear and small amount of vaginal leaking was reported. Patient remained stable with no acute changes during the shift.  Optimal Comfort and Wellbeing: No pain was reported throughout the shift, and no interventions for pain were required.  Optimal Patient-Fetal Wellbeing: Vaginal leakage was clear and small in amount. Vital signs remained stable.

## 2024-04-10 NOTE — Care Plan (Signed)
" °  Problem: Adult Inpatient Plan of Care Goal: Absence of Hospital-Acquired Illness or Injury Outcome: Shift Focus Intervention: Identify and Manage Fall Risk Recent Flowsheet Documentation Taken 04/10/2024 0800 by Abran Chiquita MATSU, RN Safety Interventions: low bed Intervention: Prevent Skin Injury Recent Flowsheet Documentation Taken 04/10/2024 0800 by Abran Chiquita MATSU, RN Positioning for Skin: Supine/Back Intervention: Prevent and Manage VTE (Venous Thromboembolism) Risk Recent Flowsheet Documentation Taken 04/10/2024 1200 by Abran Chiquita MATSU, RN Anti-Embolism Device Status: Refused Taken 04/10/2024 1000 by Abran Chiquita MATSU, RN Anti-Embolism Device Status: Refused Taken 04/10/2024 0800 by Abran Chiquita MATSU, RN Anti-Embolism Device Status: Refused Goal: Optimal Comfort and Wellbeing Outcome: Shift Focus Goal: Readiness for Transition of Care Outcome: Shift Focus Goal: Rounds/Family Conference Outcome: Shift Focus   Problem: Hospitalized Perinatal Patient Goal: Optimal Patient-Fetal Wellbeing Outcome: Shift Focus  Shift Summary Vitals stable. No c/o pain. Denies fundal tenderness or s/s of infection. Group B Strep screen returned as not detected.  Fetal heart rate variability remained moderate, and reactive. Vaginal drainage was clear and small.  Pain was consistently assessed at 0, and comfort measures were maintained.  Safety interventions and environment were maintained throughout the shift, with no new hospital-acquired issues.  Overall, status remained stable with no significant changes in candidacy scores or readmission risk.   Absence of Hospital-Acquired Illness or Injury: No new hospital-acquired issues were documented; skin and IV site assessments remained clean, dry, and intact throughout the shift, and safety interventions such as low bed and side rails were consistently maintained.   Optimal Comfort and Wellbeing: Pain remained at 0 throughout the shift, and positioning was  supine/back with pillows for comfort.   Readiness for Transition of Care: Unplanned readmission score showed a slight decrease, and IRF and LTACH candidacy scores remained stable at 0 and -75, respectively.   Rounds/Family Conference: Family/significant other visited and received updates during the morning hours.   Optimal Patient-Fetal Wellbeing: Fetal heart rate variability was moderate, vaginal drainage was clear and small, and preeclampsia assessment was within defined limits; Group B Strep screen was not detected.  "

## 2024-04-10 NOTE — Progress Notes (Signed)
 ------------------------------------------------------------------------------- Attestation signed by Vora, Neeta Lakshmi, MD at 04/10/24 [phone] Attending Provider Note I saw and evaluated the patient, participating in the key portions of the service.  I reviewed the residents note.  I agree with the residents findings and plan.   I personally spent 25 minutes on the floor or unit in direct patient care. The direct patient care time included face-to-face time with the patient, reviewing the patient's chart, communicating with the family and/or other professionals and coordinating care. Greater than 50% of this time was spent in counseling or coordinating care with the patient regarding the issues described above.   Annette LITTIE Acosta, MD   -------------------------------------------------------------------------------  Antepartum Daily Progress Note  ASSESSMENT AND PLAN   Hospital Day: 4   Annette Cline is a 39 y.o. [id number] at [redacted]w[redacted]d, admitted for PPROM on 1/15 at [redacted]w[redacted]d.   PPROM  Oligohydramnios  - Initially presented to [facility] due to LoF since 1/15 at 0030. Was grossly ruptured and triple positive on their exam. OSH US  Report shows AFI 2.8 cm consistent with PPROM. Left AMA due to disagreement with RN staff.  - Represented to [facility] where she was again confirmed to be triple positive (Nitrazine, ferning, and pooling). Based on this recommended, latency antibiotics, BMZ for lung maturity, and inpatient admission until 34 weeks with IoL at that time. - Admit CBC nml (WBC 9.4), cmp nml, vaginitis, gc/ct and urine cx neg - gbs pending (1/15) - BMZ 1/15-16 - Latency abx 1/15-22   >> Continue expectant management until 34 weeks unless additional complications arise. >> Can transition back to FM at time of IoL if determined.      Tobacco Use Disorder - 0.5 pack of cigarettes daily.  - Ordered for nicotine  patch and gum PRN while hospitalized.    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 Signed medicaid consents with previous OBGYN, resigned 1/15 on admission.  PNC: [facility] (FM) A POS / RI / VI / Tdap declined previously / COV will assess / Flu will assess / RSV will assess Contraception: tubal ligation / Feeding: Breast  VTE Prophylaxis: SCDs in bed  Dispo: Inpatient management to delivery  Patient was seen and plan of care was discussed with the Attending, Dr. Vora, who agrees with the above statement and plan.  Annette Cline, WHNP   SUBJECTIVE   Still having leaking. Denies fever, chills. She denies regular contractions, bleeding. Fetal movement: normal    OBJECTIVE  Vital signs in last 24 hours Temp:  [36.5 C (97.7 F)-37 C (98.6 F)] 36.6 C (97.9 F) Pulse:  [64-76] 66 Resp:  [18-20] 18 BP: (104-112)/(46-58) 105/54 MAP (mmHg):  [60-71] 66 SpO2:  [98 %-100 %] 98 %  Intake/Output last 24 hours No intake/output data recorded. No intake/output data recorded.  Physical Exam General: No acute distress Abdomen: Soft, gravid, non-tender. No rebound/guarding. Genitourinary: deferred Musculoskeletal: Grossly normal ROM. Extremities: Warm and well-perfused.  Trace edema.  Neurological: Alert and conversational.  Psychological: Appropriate mood/affect.  Data Review Lab results last 24 hours:  Recent Results (from the past 24 hours)  Type and Screen with Confirmation ABORh   Collection Time: 04/10/24  5:10 AM  Result Value Ref Range   ABO Grouping A POS    Antibody Screen  NEG

## 2024-04-10 NOTE — Procedures (Signed)
------------------------------------------------------------------------------- °  Attestation signed by Judge Graciela Handing, MD at 04/10/24 1449 MFM faculty  I agree with the above antenatal testing interpretation and recommendations.  Neeta Vora, MD  -------------------------------------------------------------------------------  Annette Cline, a [phone] at [redacted]w[redacted]d with an EDD of 05/29/2024, by Ultrasound, was seen at 3 [facility] for a nonstress test.     Interpretation: -  140 - Variability: Moderate (Between 6 and 25 BPM) - Pattern: Accelerations -  Decelerations: None  Time: See OBIX         Reactive  Signed/Authenticated by:  Kevin A Tam, MD

## 2024-04-11 NOTE — Progress Notes (Signed)
 ------------------------------------------------------------------------------- Attestation signed by Franchot Bobetta Bare, MD at 04/12/24 [phone] MFM Attending Physician Attestation  I saw and evaluated the patient, participating in the key portions of the service.  I reviewed the resident's note.  I agree with the resident's findings and plan.   Coding for today's encounter was based on the subsequent visit: time. I personally spent 35 minutes (moderate level complexity- [phone]) face-to-face and non-face-to-face in the care of this patient, which includes all pre, intra, and post visit time on the date of service..  The resident/fellow spent 40 minutes in collaboration of patient care on day of service.  Ebony B. Franchot, MD, MPH Associate Professor Division of Maternal-Fetal Medicine Department of Obstetrics & Gynecology [employer]  at [facility]   -------------------------------------------------------------------------------  Antepartum Daily Progress Note  ASSESSMENT AND PLAN   Hospital Day: 5   Annette Cline is a 39 y.o. [id number] at [redacted]w[redacted]d, admitted for PPROM on 1/15 at [redacted]w[redacted]d.   PPROM  Oligohydramnios  - Initially presented to [facility] due to LoF since 1/15 at 0030. Was grossly ruptured and triple positive on their exam. OSH US  Report shows AFI 2.8 cm consistent with PPROM. Left AMA due to disagreement with RN staff.  - Represented to [facility] where she was again confirmed to be triple positive (Nitrazine, ferning, and pooling). Based on this recommended, latency antibiotics, BMZ for lung maturity, and inpatient admission until 34 weeks with IoL at that time. - Admit CBC nml (WBC 9.4), cmp nml, vaginitis, gc/ct and urine cx neg - gbs pending (1/15) - BMZ 1/15-16 - Latency abx 1/15-22   >> Continue expectant management until 34 weeks unless additional complications arise. >> Can transition back to FM at time of IoL if determined.      Tobacco Use Disorder - 0.5  pack of cigarettes daily.  - Ordered for nicotine  patch and gum PRN while hospitalized.    Grand parity - [id number] - Admit Hgb 11.6 (1/15)   History of Laparoscopic Appendectomy - 01/15/2024   Obesity  - BMI 38 - SCDs intrapartum; Consider LVX pp   Reactive airway disease -Albuterol  inhaler ordered   FWB: cephalic (1/16) - daily  - Last growth: 04/08/24 at 109w5d EFW 1854g/(17%) AC 32%, cephalic, normal fluid 3.0/5.6 - Betamethasone : 1/15-16 - GBS: collected and pending (1/15)   MOD: Anticipate vaginal delivery Consented for C/S on admission for fetal distress and to include b/l salpingectomy. Signed medicaid consents with previous OBGYN, resigned 1/15 on admission.  PNC: [facility] (FM) A POS / RI / VI / Tdap declined previously / COV will assess / Flu will assess / RSV will assess Contraception: tubal ligation / Feeding: Breast  VTE Prophylaxis: SCDs in bed  Dispo: Inpatient management to delivery  Patient was seen and plan of care was discussed with the Attending, Dr. Franchot, who agrees with the above statement and plan.  Roslynn LITTIE Hamming, MD   SUBJECTIVE   Pt reports some leg cramping overnight with sleeping, otherwise no complaints. Reports persistent leakage of fluid without fever, chills or vaginal bleeding. Denies pelvic pain. Denies ctx.   OBJECTIVE  Vital signs in last 24 hours Temp:  [36.6 C (97.8 F)-36.7 C (98 F)] 36.7 C (98 F) Pulse:  [66-76] 76 Resp:  [18] 18 BP: (103-117)/(49-57) 103/57 MAP (mmHg):  [63-69] 69 SpO2:  [98 %-100 %] 100 %  Intake/Output last 24 hours No intake/output data recorded. No intake/output data recorded.  Physical Exam General: No acute distress Abdomen:  Soft, gravid, non-tender. No rebound/guarding. Genitourinary: deferred Musculoskeletal: Grossly normal ROM. Extremities: Warm and well-perfused.  Trace edema.  Neurological: Alert and conversational.  Psychological: Appropriate mood/affect.    Data  Review Lab results last 24 hours:  No results found for this or any previous visit (from the past 24 hours).  Roslynn Hamming, MD PGY-4 Department of Obstetrics and Gynecology [employer]  at [facility]

## 2024-04-11 NOTE — Nursing Note (Signed)
 Bedside report given to Doyal Breaker RN.

## 2024-04-11 NOTE — Nursing Note (Signed)
 Pt is tearful and nervous about C/S delivery. Plan of care reviewed with patient. Emotional support given and answered questions and concerns. Daughter at bedside providing emotional support. Arvin Saltness MD at bedside to discuss theC/S delivery and  plan of care.

## 2024-04-11 NOTE — Care Plan (Signed)
------------------------------------------------------------------------------- °  Summary: Shift Summary -------------------------------------------------------------------------------  Shift Summary Calcium  carbonate was given for indigestion during the shift. Vital signs remained within defined limits. Vaginal drainage was monitored, with a slight increase in amount from yesterday but remained clear. Family was present at bedside and safety interventions were maintained throughout the shift. Overall, comfort and wellbeing were supported and patient-fetal status remained stable during the shift.  Optimal Comfort and Wellbeing: No pain was reported throughout the shift, and calcium  carbonate was administered PRN for indigestion; family was present at bedside and safety interventions were maintained.  Optimal Patient-Fetal Wellbeing: Vaginal fluid remained clear and small in amount with a slight increase from previous days, vital signs were stable, and preeclampsia assessment was within defined limits.

## 2024-04-12 NOTE — Unmapped External Note (Signed)
 ------------------------------------------------------------------------------- Attestation signed by Rendell Tinnie HERO, MD at 04/12/24 1812 I was present and scrubbed for all key portions of the procedure. Primary cesarean section for non-reassuring fetal heart tracing in the setting of PPROM. The patient desired permanent sterilization and bilateral salpingectomy was performed. Initial bleeding at left tubal site controlled with electrocautery with site confirmed to be hemostatic.   Tinnie Rendell, MD -------------------------------------------------------------------------------  [facility] Labor & Delivery Cesarean Section Operative Note  Date: April 12, 2024  Procedure:  1. primary C-Section, Low Transverse [251] 2. Bilateral salpingectomy  Pre-operative Diagnosis:  1. [redacted]w[redacted]d intrauterine pregnancy 2. Fetal Intolerance of Labor 3. Undesired fertility; desires permanent sterilization.   Post-Operative Diagnosis: same; delivered   Attending Surgeon: Rendell Tinnie HERO, MD  Resident Surgeon: Mittie Ruts, MD PGY-2  Student Involvement: None  Anesthesia: Epidural  Operative Findings: 1. female infant in Vertex. Apgar scores of 8   at 1 minute and 9   at 5 minutes and a birthweight pending per protocol. 2. Normal bilateral fallopian tubes and ovaries  3. Normal Uterus and uterine cavity  Quantitative Blood Loss: 250 mL  Urine Output: 300 mL of clear yellow urine   IV Fluids: 1,500 mL crystalloid  Drains: Foley to gravity         Specimens:  1. Placenta: Pathology 2. Bilateral fallopian tubes  3. Cord blood: Lab         Complications:  None         Disposition: PACU - hemodynamically stable.         Indications: Annette Cline is a 39 y.o. [id number] at [redacted]w[redacted]d who was admitted for PPROM at [redacted]w[redacted]d GA. Patient was admitted with plan for latency antibiotics and BMZ. On hospital day  Patient was otherwise complicated by tobacco use disorder, grand multiparity,  BMI 38, and history of laparoscopic appendectomy. On hospital day 5 patient had non-reassuring antenatal testing and progression of cervical dilation. On hospital day 6 at [redacted]w[redacted]d GA patient was amenable for pCS for non-reassuring fetal heart tracing limiting induction of labor.  The risks, benefits, complications, and expected outcomes of cesarean section were discussed with the patient. The patient agreed with the proposed plan; and all consents were signed and witnessed. She agreed to accept blood products if necessary.  Procedure  The patient was taken to the operating room where Epidural anesthesia was found to be adequate. The patient was placed in the supine position with a left lateral tilt.  A Foley catheter was placed in the operating suite. The patient was prepped and draped in the routine sterile fashion.   A Pfannenstiel skin incision was made with a scalpel and carried through to the underlying fascia. The fascia was incised in the midline sharply with the scalpel, and the fascial incision was extended bluntly in Joel-Cohen fashion.   The rectus muscles were divided in the midline, and the peritoneum was entered using blunt dissection. The peritoneal incision was extended superiorly and inferiorly. The bladder blade was then placed.  Next, a low transverse uterine incision was made and carried down sharply until the amniotic membranes were ruptured. Clear fluid was noted.  The incision was then extended using the bandage scissors.  The fetal head was grasped, flexed and delivered through the incision. A vigorous female infant was delivered with findings as noted above. Delayed cord clamping was performed for 30 seconds then the cord was doubly clamped and cut. The infant was handed off to the awaiting nurse.  At this time,  a specimen of cord blood was obtained and oxytocin  was added to the IV fluid. The placenta was removed -- Expressed.  The uterus was exteriorized and draped with a moist lap.  The endometrial cavity was wiped clean with a dry lap to ensure that no placental fragments remained. The hysterotomy was inspected and noted to be free of extensions. The hysterotomy was closed with a running locking 0-Vicryl suture from bilateral aspects of the hysterotomy meeting in midline. Next, a second imbricating layer of 0-Monocryl was placed to obtain hemostasis. The hysterotomy was inspected and noted to be hemostatic.   BTS: Next, attention was then turned to to the patient's right fallopian tube which was gently elevated with the babcock clamp. Given significant edema of fallopian tube with close proximity to underlying uterine vasculature limiting ability to locate avascular planes proceeded with bilateral salpingectomy with Ligasure electrocautery along the length of the right salpingectomy. Attention was then turned to the left fallopian tube. Ligasure electrocautery was then utilized along the left fallopian tube to transect the entire length of tube. Repeat electrocautery closure of x1 site on the right fallopian tube and electrocautery closure at x1 site with multiple attempts on patient's right side was required to obtain excellent hemostasis.    Skin incision was extended by 1 cm bilaterally to allow for ease of uterus return to the abdomen without stress on bilateral salpingectomy sites. The hysterotomy sites and bilateral tubal sites were inspected and noted to be hemostatic. Arista was applied to hysterotomy site and bilateral tubal sites following ensuring hemostasis was obtained following return to abdomen. The subfascial space was confirmed to be hemostatic. The fascia was reapproximated using 0-Vicryl in a single non-locking layer. The subcutaneous tissues were made hemostatic with electrocautery. The subcutaneous tissue was re-approximated with 3-0 Vicryl. The skin was closed with 3-0 Monocryl.    The patient tolerated the procedure well.  All counts were correct x3, and the  patient was transferred to the recovery room in good condition. Prophylactic cefazolin  and azithromycin  were administered prior to skin incision.  Pregnancy risk: The risk of the pregnancy was high.  Paighton H Noel, MD 04/12/2024  10:26 AM

## 2024-04-13 NOTE — Care Plan (Signed)
 V/S and assessment WDL. I/O adequate - foley removed and first void completed. Tolerating regular diet well. Patient ambulating in room. Pt pumping and taking milk down to [facility]. Rest promoted. Safety measures implemented.  Problem: Adult Inpatient Plan of Care Goal: Absence of Hospital-Acquired Illness or Injury Intervention: Identify and Manage Fall Risk Recent Flowsheet Documentation Taken 04/12/2024 2015 by Waddell Harlene DEL, RN Safety Interventions:  low bed  lighting adjusted for tasks/safety  family at bedside  fall reduction program maintained Intervention: Prevent Skin Injury Recent Flowsheet Documentation Taken 04/12/2024 2015 by Waddell Harlene DEL, RN Skin Protection: adhesive use limited Intervention: Prevent and Manage VTE (Venous Thromboembolism) Risk Recent Flowsheet Documentation Taken 04/12/2024 2015 by Waddell Harlene DEL, RN Anti-Embolism Device Status: (pt ambulating) Refused   Problem: Wound Goal: Optimal Functional Ability Intervention: Optimize Functional Ability Recent Flowsheet Documentation Taken 04/12/2024 2015 by Waddell Harlene DEL, RN Activity Management: ambulated in room Goal: Skin Health and Integrity Intervention: Optimize Skin Protection Recent Flowsheet Documentation Taken 04/12/2024 2015 by Waddell Harlene DEL, RN Activity Management: ambulated in room Pressure Reduction Techniques: frequent weight shift encouraged Pressure Reduction Devices: pressure-redistributing mattress utilized Skin Protection: adhesive use limited   Problem: Postpartum (Cesarean Delivery) Goal: Anesthesia/Sedation Recovery Intervention: Optimize Anesthesia Recovery Recent Flowsheet Documentation Taken 04/12/2024 2015 by Waddell Harlene DEL, RN Safety Interventions:  low bed  lighting adjusted for tasks/safety  family at bedside  fall reduction program maintained

## 2024-04-18 ENCOUNTER — Ambulatory Visit

## 2024-04-18 DIAGNOSIS — Z3480 Encounter for supervision of other normal pregnancy, unspecified trimester: Secondary | ICD-10-CM

## 2024-04-19 ENCOUNTER — Ambulatory Visit

## 2024-04-25 ENCOUNTER — Other Ambulatory Visit

## 2024-05-02 ENCOUNTER — Other Ambulatory Visit

## 2024-05-09 ENCOUNTER — Other Ambulatory Visit

## 2024-05-16 ENCOUNTER — Ambulatory Visit
